# Patient Record
Sex: Female | Born: 1975 | Race: Black or African American | Hispanic: No | Marital: Single | Smoking: Former smoker
Health system: Southern US, Community
[De-identification: ages and names within clinical notes are randomized; demographics above are authoritative.]

## PROBLEM LIST (undated history)

## (undated) DIAGNOSIS — E119 Type 2 diabetes mellitus without complications: Secondary | ICD-10-CM

## (undated) DIAGNOSIS — I10 Essential (primary) hypertension: Secondary | ICD-10-CM

## (undated) DIAGNOSIS — F419 Anxiety disorder, unspecified: Secondary | ICD-10-CM

## (undated) DIAGNOSIS — F329 Major depressive disorder, single episode, unspecified: Secondary | ICD-10-CM

## (undated) DIAGNOSIS — F32A Depression, unspecified: Secondary | ICD-10-CM

## (undated) DIAGNOSIS — E7800 Pure hypercholesterolemia, unspecified: Secondary | ICD-10-CM

## (undated) HISTORY — PX: CHOLECYSTECTOMY: SHX55

## (undated) HISTORY — PX: COLONOSCOPY WITH PROPOFOL: SHX5780

---

## 2007-04-03 ENCOUNTER — Emergency Department: Payer: Self-pay | Admitting: Emergency Medicine

## 2008-11-29 ENCOUNTER — Emergency Department: Payer: Self-pay | Admitting: Emergency Medicine

## 2013-02-05 ENCOUNTER — Emergency Department: Payer: Self-pay | Admitting: Emergency Medicine

## 2013-12-26 LAB — HM HIV SCREENING LAB: HM HIV Screening: NEGATIVE

## 2015-08-15 DIAGNOSIS — E118 Type 2 diabetes mellitus with unspecified complications: Secondary | ICD-10-CM | POA: Insufficient documentation

## 2015-08-15 DIAGNOSIS — E119 Type 2 diabetes mellitus without complications: Secondary | ICD-10-CM | POA: Insufficient documentation

## 2015-08-15 DIAGNOSIS — I10 Essential (primary) hypertension: Secondary | ICD-10-CM | POA: Insufficient documentation

## 2016-03-17 DIAGNOSIS — F419 Anxiety disorder, unspecified: Secondary | ICD-10-CM | POA: Insufficient documentation

## 2017-01-05 DIAGNOSIS — Z716 Tobacco abuse counseling: Secondary | ICD-10-CM | POA: Insufficient documentation

## 2017-01-05 DIAGNOSIS — R634 Abnormal weight loss: Secondary | ICD-10-CM | POA: Insufficient documentation

## 2017-01-05 DIAGNOSIS — Z0000 Encounter for general adult medical examination without abnormal findings: Secondary | ICD-10-CM | POA: Insufficient documentation

## 2017-02-10 ENCOUNTER — Other Ambulatory Visit: Payer: Self-pay

## 2017-02-10 DIAGNOSIS — I10 Essential (primary) hypertension: Secondary | ICD-10-CM | POA: Diagnosis not present

## 2017-02-10 DIAGNOSIS — E119 Type 2 diabetes mellitus without complications: Secondary | ICD-10-CM | POA: Insufficient documentation

## 2017-02-10 DIAGNOSIS — K2980 Duodenitis without bleeding: Secondary | ICD-10-CM | POA: Diagnosis not present

## 2017-02-10 DIAGNOSIS — R1011 Right upper quadrant pain: Secondary | ICD-10-CM | POA: Diagnosis present

## 2017-02-10 LAB — CBC
HCT: 39.4 % (ref 35.0–47.0)
Hemoglobin: 13.2 g/dL (ref 12.0–16.0)
MCH: 30.4 pg (ref 26.0–34.0)
MCHC: 33.5 g/dL (ref 32.0–36.0)
MCV: 90.9 fL (ref 80.0–100.0)
Platelets: 369 K/uL (ref 150–440)
RBC: 4.33 MIL/uL (ref 3.80–5.20)
RDW: 12.4 % (ref 11.5–14.5)
WBC: 12 K/uL — ABNORMAL HIGH (ref 3.6–11.0)

## 2017-02-10 LAB — COMPREHENSIVE METABOLIC PANEL
ALK PHOS: 99 U/L (ref 38–126)
ALT: 21 U/L (ref 14–54)
AST: 22 U/L (ref 15–41)
Albumin: 3.9 g/dL (ref 3.5–5.0)
Anion gap: 11 (ref 5–15)
BILIRUBIN TOTAL: 0.6 mg/dL (ref 0.3–1.2)
BUN: 7 mg/dL (ref 6–20)
CALCIUM: 9.2 mg/dL (ref 8.9–10.3)
CO2: 24 mmol/L (ref 22–32)
CREATININE: 0.93 mg/dL (ref 0.44–1.00)
Chloride: 100 mmol/L — ABNORMAL LOW (ref 101–111)
GFR calc non Af Amer: >60 mL/min (ref >60)
Glucose, Bld: 196 mg/dL — ABNORMAL HIGH (ref 65–99)
Potassium: 3.8 mmol/L (ref 3.5–5.1)
SODIUM: 135 mmol/L (ref 135–145)
TOTAL PROTEIN: 8 g/dL (ref 6.5–8.1)

## 2017-02-10 LAB — URINALYSIS, COMPLETE (UACMP) WITH MICROSCOPIC
BILIRUBIN URINE: NEGATIVE
Glucose, UA: NEGATIVE mg/dL
HGB URINE DIPSTICK: NEGATIVE
Ketones, ur: 20 mg/dL — AB
Leukocytes, UA: NEGATIVE
Nitrite: NEGATIVE
Protein, ur: NEGATIVE mg/dL
SPECIFIC GRAVITY, URINE: 1.015 (ref 1.005–1.030)
pH: 5 (ref 5.0–8.0)

## 2017-02-10 LAB — LIPASE, BLOOD: Lipase: 19 U/L (ref 11–51)

## 2017-02-10 NOTE — ED Triage Notes (Signed)
Pt in with co upper abd pain worse to ruq since yesterday, did vomit earlier today. No diarrhea or hx of the same.

## 2017-02-11 ENCOUNTER — Emergency Department: Payer: BLUE CROSS/BLUE SHIELD

## 2017-02-11 ENCOUNTER — Emergency Department
Admission: EM | Admit: 2017-02-11 | Discharge: 2017-02-11 | Disposition: A | Discharge status: Home / Self Care | Payer: BLUE CROSS/BLUE SHIELD | Attending: Emergency Medicine | Admitting: Emergency Medicine

## 2017-02-11 DIAGNOSIS — K2980 Duodenitis without bleeding: Secondary | ICD-10-CM

## 2017-02-11 DIAGNOSIS — R1011 Right upper quadrant pain: Secondary | ICD-10-CM

## 2017-02-11 HISTORY — DX: Type 2 diabetes mellitus without complications: E11.9

## 2017-02-11 HISTORY — DX: Essential (primary) hypertension: I10

## 2017-02-11 LAB — PREGNANCY, URINE: Preg Test, Ur: NEGATIVE

## 2017-02-11 MED ORDER — MORPHINE SULFATE (PF) 4 MG/ML IV SOLN
4.0000 mg | Freq: Once | INTRAVENOUS | Status: AC
  Administered 2017-02-11: 4 mg via INTRAVENOUS
  Filled 2017-02-11: qty 1

## 2017-02-11 MED ORDER — MORPHINE SULFATE (PF) 4 MG/ML IV SOLN
4.0000 mg | Freq: Once | INTRAVENOUS | Status: AC
  Administered 2017-02-11: 4 mg via INTRAVENOUS

## 2017-02-11 MED ORDER — ONDANSETRON HCL 4 MG/2ML IJ SOLN
4.0000 mg | Freq: Once | INTRAMUSCULAR | Status: AC
  Administered 2017-02-11: 4 mg via INTRAVENOUS

## 2017-02-11 MED ORDER — OXYCODONE-ACETAMINOPHEN 5-325 MG PO TABS
1.0000 | ORAL_TABLET | Freq: Once | ORAL | Status: AC
  Administered 2017-02-11: 1 via ORAL
  Filled 2017-02-11: qty 1

## 2017-02-11 MED ORDER — IOPAMIDOL (ISOVUE-300) INJECTION 61%
125.0000 mL | Freq: Once | INTRAVENOUS | Status: AC | PRN
  Administered 2017-02-11: 125 mL via INTRAVENOUS

## 2017-02-11 MED ORDER — ONDANSETRON HCL 4 MG/2ML IJ SOLN
INTRAMUSCULAR | Status: AC
  Administered 2017-02-11: 4 mg via INTRAVENOUS
  Filled 2017-02-11: qty 2

## 2017-02-11 MED ORDER — OXYCODONE-ACETAMINOPHEN 5-325 MG PO TABS: 1.0000 | ORAL_TABLET | ORAL | 0 refills | Status: DC | PRN

## 2017-02-11 MED ORDER — ONDANSETRON 4 MG PO TBDP: 4.0000 mg | ORAL_TABLET | Freq: Three times a day (TID) | ORAL | 0 refills | Status: DC | PRN

## 2017-02-11 MED ORDER — MORPHINE SULFATE (PF) 4 MG/ML IV SOLN
INTRAVENOUS | Status: AC
  Administered 2017-02-11: 4 mg via INTRAVENOUS
  Filled 2017-02-11: qty 1

## 2017-02-11 NOTE — ED Provider Notes (Signed)
[facility] Emergency Department Provider Note    First MD Initiated Contact with Patient 02/11/17 [phone]     (approximate)  I have reviewed the triage vital signs and the nursing notes.   HISTORY  Chief Complaint Abdominal Pain   HPI Carmen [name] is a 42 y.o. female below list of chronic medical conditions presents to the emergency department upper abdominal discomfort which is currently 10 out of 10 which began yesterday.  Patient admits to one episode of vomiting which happened earlier today night.  Patient denies any diarrhea or constipation.  Patient denies any fever afebrile on presentation temperature 99.5.  Patient denies any personal family history of disease.   Past Medical History:  Diagnosis Date  . Diabetes mellitus without complication ([place])   . Hypertension     There are no active problems to display for this patient.     Prior to Admission medications   Not on File    Allergies No known drug allergies No family history on file.  Social History Social History   Tobacco Use  . Smoking status: Not on file  Substance Use Topics  . Alcohol use: Not on file  . Drug use: Not on file    Review of Systems Constitutional: No fever/chills Eyes: No visual changes. ENT: No sore throat. Cardiovascular: Denies chest pain. Respiratory: Denies shortness of breath. Gastrointestinal: No abdominal pain.  No nausea, no vomiting.  No diarrhea.  No constipation. Genitourinary: Negative for dysuria. Musculoskeletal: Negative for neck pain.  Negative for back pain. Integumentary: Negative for rash. Neurological: Negative for headaches, focal weakness or numbness.  ____________________________________________   PHYSICAL EXAM:  VITAL SIGNS: ED Triage Vitals  Enc Vitals Group     BP 02/10/17 2230 134/89     Pulse Rate 02/10/17 2228 88     Resp 02/10/17 2228 20     Temp 02/10/17 2228 99.5 F (37.5 C)     Temp Source 02/10/17 2228  Oral     SpO2 02/10/17 2228 97 %     Weight 02/10/17 2228 119.7 kg (264 lb)     Height 02/10/17 2228 1.499 m (4\' 11" )     Head Circumference --      Peak Flow --      Pain Score 02/10/17 2227 8     Pain Loc --      Pain Edu? --      Excl. in [place]? --     Constitutional: Alert and oriented.  Apparent discomfort Eyes: Conjunctivae are normal. Head: Atraumatic. Mouth/Throat: Mucous membranes are moist.  Oropharynx non-erythematous. Neck: No stridor.   Cardiovascular: Normal rate, regular rhythm. Good peripheral circulation. Grossly normal heart sounds. Respiratory: Normal respiratory effort.  No retractions. Lungs CTAB. Gastrointestinal: Right upper quadrant tenderness to palpation. No distention.  Musculoskeletal: No lower extremity tenderness nor edema. No gross deformities of extremities. Neurologic:  Normal speech and language. No gross focal neurologic deficits are appreciated.  Skin:  Skin is warm, dry and intact. No rash noted. Psychiatric: Mood and affect are normal. Speech and behavior are normal.  ____________________________________________   LABS (all labs ordered are listed, but only abnormal results are displayed)  Labs Reviewed  CBC - Abnormal; Notable for the following components:      Result Value   WBC 12.0 (*)    All other components within normal limits  COMPREHENSIVE METABOLIC PANEL - Abnormal; Notable for the following components:   Chloride 100 (*)    Glucose, Bld  196 (*)    All other components within normal limits  URINALYSIS, COMPLETE (UACMP) WITH MICROSCOPIC - Abnormal; Notable for the following components:   Color, Urine YELLOW (*)    APPearance HAZY (*)    Ketones, ur 20 (*)    All other components within normal limits  LIPASE, BLOOD  PREGNANCY, URINE   __________________________  RADIOLOGY I, Penrose N BROWN, personally viewed and evaluated these images (plain radiographs) as part of my medical decision making, as well as reviewing the  written report by the radiologist.  Ct Abdomen Pelvis W Contrast  Result Date: 02/11/2017 CLINICAL DATA:  Acute onset of upper abdominal pain and vomiting. EXAM: CT ABDOMEN AND PELVIS WITH CONTRAST TECHNIQUE: Multidetector CT imaging of the abdomen and pelvis was performed using the standard protocol following bolus administration of intravenous contrast. CONTRAST:  163mL ISOVUE-300 IOPAMIDOL (ISOVUE-300) INJECTION 61% COMPARISON:  Right upper quadrant ultrasound performed earlier today at 5:22 a.m. FINDINGS: Lower chest: The visualized lung bases are grossly clear. The visualized portions of the mediastinum are unremarkable. Hepatobiliary: The liver is unremarkable in appearance. The gallbladder is unremarkable in appearance. The common bile duct remains normal in caliber. Pancreas: Minimal soft tissue inflammation is noted about the head of the pancreas; this may reflect mild pancreatitis or possibly an underlying duodenal process. The remainder of the pancreas is unremarkable. There is no evidence of devascularization or pseudocyst formation. Spleen: The spleen is unremarkable in appearance. Adrenals/Urinary Tract: The adrenal glands are unremarkable in appearance. The kidneys are within normal limits. There is no evidence of hydronephrosis. No renal or ureteral stones are identified. No perinephric stranding is seen. Stomach/Bowel: The stomach is unremarkable in appearance. Mild soft tissue inflammation about the second segment of the duodenum could reflect mild duodenitis. Underlying ulceration cannot be excluded. The appendix is normal in caliber, without evidence of appendicitis. The colon is unremarkable in appearance. Vascular/Lymphatic: The abdominal aorta is unremarkable in appearance. The inferior vena cava is grossly unremarkable. No retroperitoneal lymphadenopathy is seen. No pelvic sidewall lymphadenopathy is identified. Reproductive: The bladder is mildly distended and grossly unremarkable. A  small uterine fibroid is noted. The uterus is otherwise unremarkable. A 3.5 cm right adnexal cystic lesion is noted. The left ovary is unremarkable. Other: No additional soft tissue abnormalities are seen. Musculoskeletal: No acute osseous abnormalities are identified. The visualized musculature is unremarkable in appearance. IMPRESSION: 1. Minimal soft tissue inflammation about the head of the pancreas. This may reflect mild pancreatitis or possibly an underlying duodenal process. No evidence of devascularization or pseudocyst formation. 2. Mild soft tissue inflammation about the second segment of the duodenum could reflect mild duodenitis. Underlying ulceration cannot be excluded. 3. 3.5 cm right adnexal cystic lesion. This is likely physiologic, though pelvic ultrasound could be performed for further evaluation, on an elective nonemergent basis. 4. Small uterine fibroid noted. Electronically Signed   By: Garald Balding M.D.   On: 02/11/2017 06:28   US Abdomen Limited Ruq  Result Date: 02/11/2017 CLINICAL DATA:  Acute onset of right upper quadrant abdominal pain. EXAM: ULTRASOUND ABDOMEN LIMITED RIGHT UPPER QUADRANT COMPARISON:  None. FINDINGS: Gallbladder: No gallstones or wall thickening visualized. No sonographic Murphy sign noted by sonographer. Common bile duct: Diameter: 0.3 cm, within normal limits in caliber. Liver: No focal lesion identified. Increased parenchymal echogenicity and coarsened echotexture likely reflects fatty infiltration. Portal vein is patent on color Doppler imaging with normal direction of blood flow towards the liver. IMPRESSION: 1. No acute abnormality seen at the right  upper quadrant. 2. Mild fatty infiltration within the liver. Electronically Signed   By: Garald Balding M.D.   On: 02/11/2017 05:46    Procedures   ____________________________________________   INITIAL IMPRESSION / ASSESSMENT AND PLAN / ED COURSE  As part of my medical decision making, I reviewed the  following data within the electronic MEDICAL RECORD NUMBER4 year old female presented with above-stated history and physical exam secondary to right upper quadrant pain and vomiting.  Consider the possibility of cholelithiasis versus cholecystitis and as such ultrasound was performed which revealed no evidence of cholelithiasis or any other gallbladder disease.  Given patient's ongoing pain despite receiving IV morphine  CT scan of the abdomen performed which revealed mild inflammation pancreatic head and duodenum as per radiology report concern for possible duodenitis versus potential other etiologies.  Spoke with the patient at length regarding the necessity of following up with gastroenterology for further outpatient evaluation.  Patient will be prescribed Percocet and Zofran for home.  Informed patient not to drive or operate machinery or perform any activity that could result in bodily harm while taking Percocet ____________________________________________  FINAL CLINICAL IMPRESSION(S) / ED DIAGNOSES  Final diagnoses:  RUQ pain  Right upper quadrant abdominal pain  Duodenitis     MEDICATIONS GIVEN DURING THIS VISIT:  Medications  morphine 4 MG/ML injection 4 mg (4 mg Intravenous Given 02/11/17 0416)  ondansetron (ZOFRAN) injection 4 mg (4 mg Intravenous Given 02/11/17 0416)  iopamidol (ISOVUE-300) 61 % injection 125 mL (125 mLs Intravenous Contrast Given 02/11/17 0609)  morphine 4 MG/ML injection 4 mg (4 mg Intravenous Given 02/11/17 1657)     ED Discharge Orders    None       Note:  This document was prepared using Dragon voice recognition software and may include unintentional dictation errors.    Gregor Hams, MD 02/11/17 [phone]

## 2017-02-16 ENCOUNTER — Encounter: Payer: Self-pay | Admitting: Gastroenterology

## 2017-02-16 ENCOUNTER — Ambulatory Visit: Payer: BLUE CROSS/BLUE SHIELD | Admitting: Gastroenterology

## 2017-02-16 VITALS — BP 125/85 | HR 75 | Temp 98.2°F | Ht 59.0 in | Wt 253.8 lb

## 2017-02-16 DIAGNOSIS — R1011 Right upper quadrant pain: Secondary | ICD-10-CM | POA: Diagnosis not present

## 2017-02-16 DIAGNOSIS — R109 Unspecified abdominal pain: Secondary | ICD-10-CM | POA: Diagnosis not present

## 2017-02-16 MED ORDER — OMEPRAZOLE 40 MG PO CPDR: 40.0000 mg | DELAYED_RELEASE_CAPSULE | Freq: Every day | ORAL | 3 refills | Status: DC

## 2017-02-16 NOTE — Progress Notes (Signed)
[name] MD, MRCP(U.K) [address]  [place]  [place], [place] [address]  Main: [phone]  Fax: [phone]   Gastroenterology Consultation  Referring Provider:     No ref. provider found Primary Care Physician:  System, Pcp Not In Primary Gastroenterologist:  Dr. [name]  Reason for Consultation:     Abdominal pain         HPI:   Carmen [name] is a 42 y.o. y/o female She has been referred for abdominal pain.  She was seen at the ER on 02/11/2017 when she presented with abdominal pain began the day prior.  It was a associated with one episode of vomiting.  No other lower GI symptoms.  She underwent a CT scan of the abdomen and pelvis which demonstrated minimal soft tissue inflammation around the head of the pancreas.  Mild soft tissue inflammation around the second segment of the duodenum which could reflect mild duodenitis.  Underlying ulceration cannot be excluded.  3.5 cm right adnexal cystic lesion.  She underwent a right upper quadrant ultrasound which demonstrated no 6 acute abnormality mild fatty infiltration of the liver.  Common bile duct is 3 mm.  No gallstones were seen.  Lab results suggested a normal lipase of 19 CMP demonstrated an elevated glucose otherwise her transaminases, alkaline phosphatase, total bilirubin were normal.  Creatinine was 0.93.  Hemoglobin of 13.2 g.  Urine analysis showed no evidence of infection.  And pregnancy test was negative.    Interval history 02/11/2017 to 02/16/2017   Abdominal pain: Onset: A few days prior to the ER visit. Mostly on 02/09/17. Got worse on new years, Since the ER visit it is on and off.  Site :RUQ  Radiation: was travelling to her umbilicus and now mostly in RUQ. She has the pain now  Severity :not severe. Like an ache  Nature of pain:, annoying in nature   Aggravating factors: unsure  Relieving factors :unsure  Weight loss: lost a bit of weight  NSAID use: none  PPI use :none  Gall bladder surgery: no - no  family history of GB issues  Frequency of bowel movements: -sometimes everyday - sometimes every few days. Lately going daily .  Change in bowel movements: no  Relief with bowel movements: yes and no  Gas/Bloating/Abdominal distension: no   Not on any chronic narcotics  Past Medical History:  Diagnosis Date  . Diabetes mellitus without complication ([place])   . Hypertension       Prior to Admission medications   Medication Sig Start Date End Date Taking? Authorizing Provider  lisinopril (PRINIVIL,ZESTRIL) 10 MG tablet Take 10 mg by mouth. 01/05/17  Yes [provider]  metFORMIN (GLUCOPHAGE) 500 MG tablet Take 1,000 mg by mouth. 01/05/17  Yes [provider]  nicotine (NICODERM CQ - DOSED IN MG/24 HR) 7 mg/24hr patch Place onto the skin. 01/05/17  Yes [provider]  sertraline (ZOLOFT) 50 MG tablet Take 50 mg by mouth. 01/05/17  Yes [provider]  ondansetron (ZOFRAN ODT) 4 MG disintegrating tablet Take 1 tablet (4 mg total) by mouth every 8 (eight) hours as needed for nausea or vomiting. 02/11/17   Gregor Hams, MD  oxyCODONE-acetaminophen (ROXICET) 5-325 MG tablet Take 1 tablet by mouth every 4 (four) hours as needed for severe pain. 02/11/17   Gregor Hams, MD    No family history on file.   Social History   Tobacco Use  . Smoking status: Not on file  Substance Use Topics  . Alcohol use: Not on file  . Drug use: Not on file    Allergies as of 02/16/2017  . (No Known Allergies)    Review of Systems:    All systems reviewed and negative except where noted in HPI.   Physical Exam:  There were no vitals taken for this visit. No LMP recorded. Psych:  Alert and cooperative. Normal mood and affect. General:   Alert,  Well-developed, well-nourished, pleasant and cooperative in NAD Head:  Normocephalic and atraumatic. Eyes:  Sclera clear, no icterus.   Conjunctiva pink. Ears:  Normal auditory acuity. Nose:  No deformity,  discharge, or lesions. Mouth:  No deformity or lesions,oropharynx pink & moist. Neck:  Supple; no masses or thyromegaly. Lungs:  Respirations even and unlabored.  Clear throughout to auscultation.   No wheezes, crackles, or rhonchi. No acute distress. Heart:  Regular rate and rhythm; no murmurs, clicks, rubs, or gallops. Abdomen:  Normal bowel sounds.  No bruits.  Soft, non-tender and non-distended without masses, hepatosplenomegaly or hernias noted.  No guarding or rebound tenderness.    Msk:  Symmetrical without gross deformities. Good, equal movement & strength bilaterally. Pulses:  Normal pulses noted. Extremities:  No clubbing or edema.  No cyanosis. Neurologic:  Alert and oriented x3;  grossly normal neurologically. Skin:  Intact without significant lesions or rashes. No jaundice. Lymph Nodes:  No significant cervical adenopathy. Psych:  Alert and cooperative. Normal mood and affect.  Imaging Studies: Ct Abdomen Pelvis W Contrast  Result Date: 02/11/2017 CLINICAL DATA:  Acute onset of upper abdominal pain and vomiting. EXAM: CT ABDOMEN AND PELVIS WITH CONTRAST TECHNIQUE: Multidetector CT imaging of the abdomen and pelvis was performed using the standard protocol following bolus administration of intravenous contrast. CONTRAST:  173mL ISOVUE-300 IOPAMIDOL (ISOVUE-300) INJECTION 61% COMPARISON:  Right upper quadrant ultrasound performed earlier today at 5:22 a.m. FINDINGS: Lower chest: The visualized lung bases are grossly clear. The visualized portions of the mediastinum are unremarkable. Hepatobiliary: The liver is unremarkable in appearance. The gallbladder is unremarkable in appearance. The common bile duct remains normal in caliber. Pancreas: Minimal soft tissue inflammation is noted about the head of the pancreas; this may reflect mild pancreatitis or possibly an underlying duodenal process. The remainder of the pancreas is unremarkable. There is no evidence of devascularization or  pseudocyst formation. Spleen: The spleen is unremarkable in appearance. Adrenals/Urinary Tract: The adrenal glands are unremarkable in appearance. The kidneys are within normal limits. There is no evidence of hydronephrosis. No renal or ureteral stones are identified. No perinephric stranding is seen. Stomach/Bowel: The stomach is unremarkable in appearance. Mild soft tissue inflammation about the second segment of the duodenum could reflect mild duodenitis. Underlying ulceration cannot be excluded. The appendix is normal in caliber, without evidence of appendicitis. The colon is unremarkable in appearance. Vascular/Lymphatic: The abdominal aorta is unremarkable in appearance. The inferior vena cava is grossly unremarkable. No retroperitoneal lymphadenopathy is seen. No pelvic sidewall lymphadenopathy is identified. Reproductive: The bladder is mildly distended and grossly unremarkable. A small uterine fibroid is noted. The uterus is otherwise unremarkable. A 3.5 cm right adnexal cystic lesion is noted. The left ovary is unremarkable. Other: No additional soft tissue abnormalities are seen. Musculoskeletal: No acute osseous abnormalities are identified. The visualized musculature is unremarkable in appearance. IMPRESSION: 1. Minimal soft tissue inflammation about the head of the pancreas. This may reflect mild pancreatitis or possibly an underlying duodenal process. No evidence of devascularization or pseudocyst formation. 2. Mild  soft tissue inflammation about the second segment of the duodenum could reflect mild duodenitis. Underlying ulceration cannot be excluded. 3. 3.5 cm right adnexal cystic lesion. This is likely physiologic, though pelvic ultrasound could be performed for further evaluation, on an elective nonemergent basis. 4. Small uterine fibroid noted. Electronically Signed   By: Garald Balding M.D.   On: 02/11/2017 06:28   US Abdomen Limited Ruq  Result Date: 02/11/2017 CLINICAL DATA:  Acute onset of  right upper quadrant abdominal pain. EXAM: ULTRASOUND ABDOMEN LIMITED RIGHT UPPER QUADRANT COMPARISON:  None. FINDINGS: Gallbladder: No gallstones or wall thickening visualized. No sonographic Murphy sign noted by sonographer. Common bile duct: Diameter: 0.3 cm, within normal limits in caliber. Liver: No focal lesion identified. Increased parenchymal echogenicity and coarsened echotexture likely reflects fatty infiltration. Portal vein is patent on color Doppler imaging with normal direction of blood flow towards the liver. IMPRESSION: 1. No acute abnormality seen at the right upper quadrant. 2. Mild fatty infiltration within the liver. Electronically Signed   By: Garald Balding M.D.   On: 02/11/2017 05:46    Assessment and Plan:   ARIANNI GALLEGO is a 42 y.o. y/o female has been referred for abdominal pain that occurred to the emergency room on 02/10/2017.  A CT scan of the abdomen showed fluid around the pancreas which can sometimes could be suggestive of pancreatitis but her lipase was normal and her abdominal pain was not typical of pancreatitis.  It could also indicate duodenitis which is very likely a possibility.  In addition incidentally an adnexal lesion was seen.  She needs further evaluation.  Plan 1.  Check stool H. pylori antigen 2.  No NSAIDs 3.  EGD to rule out peptic ulcer disease. 4.  She needs further evaluation of her adnexal lesion with her family practice doctor.  The patient has been made aware of this and she has been advised to contact her family practice physician.  Copy of my note will be sent to her PCP.  5. HIDA scan  6. Prilosec 40 mg once daily   I have discussed alternative options, risks & benefits,  which include, but are not limited to, bleeding, infection, perforation,respiratory complication & drug reaction.  The patient agrees with this plan & written consent will be obtained.      Follow up in 6 weeks   Dr [name] MD,MRCP(U.K)

## 2017-02-16 NOTE — Addendum Note (Signed)
Addended by: Peggye Ley on: 02/16/2017 02:49 PM   Modules accepted: Orders, SmartSet

## 2017-02-17 ENCOUNTER — Encounter: Payer: Self-pay | Admitting: *Deleted

## 2017-02-17 ENCOUNTER — Encounter
Admission: RE | Disposition: A | Discharge status: Home / Self Care | Payer: Self-pay | Source: Ambulatory Visit | Attending: Gastroenterology

## 2017-02-17 ENCOUNTER — Ambulatory Visit: Payer: BLUE CROSS/BLUE SHIELD | Admitting: Certified Registered"

## 2017-02-17 ENCOUNTER — Telehealth: Payer: Self-pay | Admitting: Gastroenterology

## 2017-02-17 ENCOUNTER — Ambulatory Visit
Admission: RE | Admit: 2017-02-17 | Discharge: 2017-02-17 | Disposition: A | Discharge status: Home / Self Care | Payer: BLUE CROSS/BLUE SHIELD | Source: Ambulatory Visit | Attending: Gastroenterology | Admitting: Gastroenterology

## 2017-02-17 DIAGNOSIS — E119 Type 2 diabetes mellitus without complications: Secondary | ICD-10-CM | POA: Diagnosis not present

## 2017-02-17 DIAGNOSIS — Z7984 Long term (current) use of oral hypoglycemic drugs: Secondary | ICD-10-CM | POA: Diagnosis not present

## 2017-02-17 DIAGNOSIS — I10 Essential (primary) hypertension: Secondary | ICD-10-CM | POA: Insufficient documentation

## 2017-02-17 DIAGNOSIS — Z79899 Other long term (current) drug therapy: Secondary | ICD-10-CM | POA: Insufficient documentation

## 2017-02-17 DIAGNOSIS — R109 Unspecified abdominal pain: Secondary | ICD-10-CM | POA: Diagnosis present

## 2017-02-17 DIAGNOSIS — R1011 Right upper quadrant pain: Secondary | ICD-10-CM

## 2017-02-17 DIAGNOSIS — F419 Anxiety disorder, unspecified: Secondary | ICD-10-CM | POA: Insufficient documentation

## 2017-02-17 DIAGNOSIS — F17210 Nicotine dependence, cigarettes, uncomplicated: Secondary | ICD-10-CM | POA: Diagnosis not present

## 2017-02-17 HISTORY — PX: ESOPHAGOGASTRODUODENOSCOPY (EGD) WITH PROPOFOL: SHX5813

## 2017-02-17 LAB — GLUCOSE, CAPILLARY: Glucose-Capillary: 188 mg/dL — ABNORMAL HIGH (ref 65–99)

## 2017-02-17 LAB — POCT PREGNANCY, URINE: PREG TEST UR: NEGATIVE

## 2017-02-17 SURGERY — ESOPHAGOGASTRODUODENOSCOPY (EGD) WITH PROPOFOL
Anesthesia: General

## 2017-02-17 MED ORDER — PROPOFOL 500 MG/50ML IV EMUL
INTRAVENOUS | Status: DC | PRN
  Administered 2017-02-17: 120 ug/kg/min via INTRAVENOUS

## 2017-02-17 MED ORDER — MIDAZOLAM HCL 2 MG/2ML IJ SOLN
INTRAMUSCULAR | Status: AC
  Filled 2017-02-17: qty 2

## 2017-02-17 MED ORDER — SODIUM CHLORIDE 0.9 % IV SOLN
INTRAVENOUS | Status: DC
  Administered 2017-02-17: 1000 mL via INTRAVENOUS

## 2017-02-17 MED ORDER — MIDAZOLAM HCL 5 MG/5ML IJ SOLN
INTRAMUSCULAR | Status: DC | PRN
  Administered 2017-02-17: 2 mg via INTRAVENOUS

## 2017-02-17 MED ORDER — GLYCOPYRROLATE 0.2 MG/ML IJ SOLN
INTRAMUSCULAR | Status: DC | PRN
  Administered 2017-02-17: 0.2 mg via INTRAVENOUS

## 2017-02-17 MED ORDER — LIDOCAINE 2% (20 MG/ML) 5 ML SYRINGE
INTRAMUSCULAR | Status: DC | PRN
  Administered 2017-02-17: 25 mg via INTRAVENOUS

## 2017-02-17 MED ORDER — LIDOCAINE HCL (PF) 2 % IJ SOLN
INTRAMUSCULAR | Status: AC
  Filled 2017-02-17: qty 10

## 2017-02-17 MED ORDER — PROPOFOL 500 MG/50ML IV EMUL
INTRAVENOUS | Status: AC
  Filled 2017-02-17: qty 50

## 2017-02-17 MED ORDER — GLYCOPYRROLATE 0.2 MG/ML IJ SOLN
INTRAMUSCULAR | Status: AC
  Filled 2017-02-17: qty 1

## 2017-02-17 MED ORDER — PROPOFOL 10 MG/ML IV BOLUS
INTRAVENOUS | Status: DC | PRN
  Administered 2017-02-17: 30 mg via INTRAVENOUS
  Administered 2017-02-17: 70 mg via INTRAVENOUS

## 2017-02-17 NOTE — Telephone Encounter (Signed)
Patient needs letter for work being out for 02/17/17 and 02/16/17. Please call patient when ready for pick up.

## 2017-02-17 NOTE — Anesthesia Postprocedure Evaluation (Signed)
Anesthesia Post Note  Patient: Carmen [name]  Procedure(s) Performed: ESOPHAGOGASTRODUODENOSCOPY (EGD) WITH PROPOFOL (N/A )  Patient location during evaluation: Endoscopy Anesthesia Type: General Level of consciousness: awake and alert Pain management: pain level controlled Vital Signs Assessment: post-procedure vital signs reviewed and stable Respiratory status: spontaneous breathing and respiratory function stable Cardiovascular status: stable Anesthetic complications: no     Last Vitals:  Vitals:   02/17/17 0921 02/17/17 0931  BP: (!) 87/68 104/83  Pulse: 94 81  Resp: 20 17  Temp:    SpO2: 99% 100%    Last Pain:  Vitals:   02/17/17 0901  TempSrc: Tympanic  PainSc:                  KEPHART,WILLIAM K

## 2017-02-17 NOTE — Transfer of Care (Signed)
Immediate Anesthesia Transfer of Care Note  Patient: Carmen [name]  Procedure(s) Performed: ESOPHAGOGASTRODUODENOSCOPY (EGD) WITH PROPOFOL (N/A )  Patient Location: Endoscopy Unit  Anesthesia Type:General  Level of Consciousness: awake and alert   Airway & Oxygen Therapy: Patient Spontanous Breathing and Patient connected to face mask oxygen  Post-op Assessment: Report given to RN and Post -op Vital signs reviewed and stable  Post vital signs: Reviewed  Last Vitals:  Vitals:   02/17/17 0819 02/17/17 0900  BP: 121/79 101/66  Pulse:  92  Resp:  20  Temp:  (!) 36.4 C  SpO2: 100% 100%    Last Pain:  Vitals:   02/17/17 0900  TempSrc: Tympanic  PainSc:          Complications: No apparent anesthesia complications

## 2017-02-17 NOTE — Anesthesia Post-op Follow-up Note (Signed)
Anesthesia QCDR form completed.        

## 2017-02-17 NOTE — Anesthesia Preprocedure Evaluation (Signed)
Anesthesia Evaluation  Patient identified by MRN, date of birth, ID band Patient awake    Reviewed: Allergy & Precautions, NPO status , Patient's Chart, lab work & pertinent test results  History of Anesthesia Complications Negative for: history of anesthetic complications  Airway Mallampati: II       Dental   Pulmonary neg COPD, Current Smoker,           Cardiovascular hypertension, Pt. on medications (-) Past MI and (-) CHF (-) dysrhythmias (-) Valvular Problems/Murmurs     Neuro/Psych neg Seizures Anxiety    GI/Hepatic Neg liver ROS, neg GERD  ,  Endo/Other  diabetes, Type 2, Oral Hypoglycemic Agents  Renal/GU negative Renal ROS     Musculoskeletal   Abdominal   Peds  Hematology   Anesthesia Other Findings   Reproductive/Obstetrics                             Anesthesia Physical Anesthesia Plan  ASA: III  Anesthesia Plan: General   Post-op Pain Management:    Induction:   PONV Risk Score and Plan: 3 and TIVA and Propofol infusion  Airway Management Planned: Nasal Cannula  Additional Equipment:   Intra-op Plan:   Post-operative Plan:   Informed Consent: I have reviewed the patients History and Physical, chart, labs and discussed the procedure including the risks, benefits and alternatives for the proposed anesthesia with the patient or authorized representative who has indicated his/her understanding and acceptance.     Plan Discussed with:   Anesthesia Plan Comments:         Anesthesia Quick Evaluation

## 2017-02-17 NOTE — H&P (Signed)
Carmen Bellows, MD [address] [place], [place], [place], [address] [address], [place], [place], [place], [address] Phone: [phone]  Fax: [phone]  Primary Care Physician:  Inge Rise, NP   Pre-Procedure History & Physical: HPI:  Carmen [name] is a 42 y.o. female is here for an endoscopy    Past Medical History:  Diagnosis Date  . Diabetes mellitus without complication ([place])   . Hypertension     Past Surgical History:  Procedure Laterality Date  . COLONOSCOPY WITH PROPOFOL      Prior to Admission medications   Medication Sig Start Date End Date Taking? Authorizing Provider  lisinopril (PRINIVIL,ZESTRIL) 10 MG tablet Take 10 mg by mouth. 01/05/17   [provider]  metFORMIN (GLUCOPHAGE) 500 MG tablet Take 1,000 mg by mouth. 01/05/17   [provider]  omeprazole (PRILOSEC) 40 MG capsule Take 1 capsule (40 mg total) by mouth daily. 02/16/17   Carmen Bellows, MD  ondansetron (ZOFRAN ODT) 4 MG disintegrating tablet Take 1 tablet (4 mg total) by mouth every 8 (eight) hours as needed for nausea or vomiting. Patient not taking: Reported on 02/16/2017 02/11/17   Gregor Hams, MD  oxyCODONE-acetaminophen (ROXICET) 5-325 MG tablet Take 1 tablet by mouth every 4 (four) hours as needed for severe pain. Patient not taking: Reported on 02/16/2017 02/11/17   Gregor Hams, MD  sertraline (ZOLOFT) 50 MG tablet Take 50 mg by mouth. 01/05/17   [provider]    Allergies as of 02/16/2017  . (No Known Allergies)    History reviewed. No pertinent family history.  Social History   Socioeconomic History  . Marital status: Single    Spouse name: Not on file  . Number of children: Not on file  . Years of education: Not on file  . Highest education level: Not on file  Social Needs  . Financial resource strain: Not on file  . Food insecurity - worry: Not on file  . Food insecurity - inability: Not on file  . Transportation needs -  medical: Not on file  . Transportation needs - non-medical: Not on file  Occupational History  . Not on file  Tobacco Use  . Smoking status: Current Some Day Smoker    Packs/day: 1.00    Types: Cigarettes  . Smokeless tobacco: Never Used  Substance and Sexual Activity  . Alcohol use: No    Frequency: Never  . Drug use: No  . Sexual activity: Yes    Birth control/protection: None  Other Topics Concern  . Not on file  Social History Narrative  . Not on file    Review of Systems: See HPI, otherwise negative ROS  Physical Exam: BP 121/79   Pulse 60   Temp (!) 97.2 F (36.2 C) (Tympanic)   Resp 20   Ht 4\' 11"  (1.499 m)   Wt 253 lb (114.8 kg)   SpO2 100%   BMI 51.10 kg/m  General:   Alert,  pleasant and cooperative in NAD Head:  Normocephalic and atraumatic. Neck:  Supple; no masses or thyromegaly. Lungs:  Clear throughout to auscultation, normal respiratory effort.    Heart:  +S1, +S2, Regular rate and rhythm, No edema. Abdomen:  Soft, nontender and nondistended. Normal bowel sounds, without guarding, and without rebound.   Neurologic:  Alert and  oriented x4;  grossly normal neurologically.  Impression/Plan: Carmen [name] is here for an endoscopy  to be performed for  evaluation  of abdominal pain     Risks, benefits, limitations, and alternatives regarding endoscopy have been reviewed with the patient.  Questions have been answered.  All parties agreeable.   Carmen Bellows, MD  02/17/2017, 8:22 AM

## 2017-02-17 NOTE — Op Note (Signed)
[facility] Patient Name: Carmen [name] Procedure Date: 02/17/2017 8:43 AM MRN: [id number] Account #: [id number] Date of Birth: 11/09/1975 Admit Type: Outpatient Age: 42 Room: [facility] ENDO ROOM 1 Gender: Female Note Status: Finalized Procedure:            Upper GI endoscopy Indications:          Abdominal pain Providers:            [name] MD, MD Referring MD:         No Local Md, MD (Referring MD) Medicines:            Monitored Anesthesia Care Complications:        No immediate complications. Procedure:            Pre-Anesthesia Assessment:                       - Prior to the procedure, a History and Physical was                        performed, and patient medications, allergies and                        sensitivities were reviewed. The patient's tolerance of                        previous anesthesia was reviewed.                       - The risks and benefits of the procedure and the                        sedation options and risks were discussed with the                        patient. All questions were answered and informed                        consent was obtained.                       - ASA Grade Assessment: III - A patient with severe                        systemic disease.                       After obtaining informed consent, the endoscope was                        passed under direct vision. Throughout the procedure,                        the patient's blood pressure, pulse, and oxygen                        saturations were monitored continuously. The Endoscope                        was introduced through the mouth, and advanced to the  third part of duodenum. The upper GI endoscopy was                        accomplished with ease. The patient tolerated the                        procedure well. Findings:      The examined duodenum was normal.      The esophagus was normal.      The entire  examined stomach was normal. Biopsies were taken with a cold       forceps for histology. Impression:           - Normal examined duodenum.                       - Normal esophagus.                       - Normal stomach. Biopsied. Recommendation:       - Discharge patient to home (with escort).                       - Resume previous diet.                       - Continue present medications.                       - Await pathology results.                       - Return to my office in 6 weeks. Procedure Code(s):    --- Professional ---                       [phone], Esophagogastroduodenoscopy, flexible, transoral;                        with biopsy, single or multiple Diagnosis Code(s):    --- Professional ---                       R10.9, Unspecified abdominal pain CPT copyright 2016 American Medical Association. All rights reserved. The codes documented in this report are preliminary and upon coder review may  be revised to meet current compliance requirements. [name], MD [name] MD, MD 02/17/2017 8:54:30 AM This report has been signed electronically. Number of Addenda: 0 Note Initiated On: 02/17/2017 8:43 AM      [facility]

## 2017-02-18 ENCOUNTER — Encounter: Payer: Self-pay | Admitting: Gastroenterology

## 2017-02-18 LAB — SURGICAL PATHOLOGY

## 2017-02-19 ENCOUNTER — Encounter: Payer: Self-pay | Admitting: Gastroenterology

## 2017-02-19 ENCOUNTER — Telehealth: Payer: Self-pay

## 2017-02-19 LAB — H. PYLORI ANTIGEN, STOOL: H pylori Ag, Stl: NEGATIVE

## 2017-02-19 NOTE — Telephone Encounter (Signed)
Completed patient's work Museum/gallery conservator.   LVM for patient to come and pick-up.

## 2017-02-23 ENCOUNTER — Encounter: Payer: Self-pay | Admitting: Gastroenterology

## 2017-02-24 ENCOUNTER — Ambulatory Visit: Payer: BLUE CROSS/BLUE SHIELD | Admitting: Gastroenterology

## 2017-02-25 ENCOUNTER — Telehealth: Payer: Self-pay | Admitting: Gastroenterology

## 2017-02-25 NOTE — Telephone Encounter (Signed)
Patient called and wants to know when you scheduled her colonoscopy and she needs instuctions and an rx for prep. Please call

## 2017-02-26 NOTE — Telephone Encounter (Signed)
LVM for patient callback.   Per notes patient was only seen for EGD not colonoscopy.

## 2017-02-27 ENCOUNTER — Telehealth: Payer: Self-pay | Admitting: Gastroenterology

## 2017-02-27 NOTE — Telephone Encounter (Signed)
Patient returned your call.

## 2017-02-27 NOTE — Telephone Encounter (Signed)
LVM responding to patient's question left on voicemail.   No colonoscopy has been scheduled.  Colonoscopies for patient's under the age of 79 are due to a family history of colon cancer or polyps. Most insurances do not cover colonoscopies without said diagnoses.

## 2017-03-04 ENCOUNTER — Ambulatory Visit
Admission: RE | Admit: 2017-03-04 | Discharge: 2017-03-04 | Disposition: A | Discharge status: Home / Self Care | Payer: BLUE CROSS/BLUE SHIELD | Source: Ambulatory Visit | Attending: Gastroenterology | Admitting: Gastroenterology

## 2017-03-04 DIAGNOSIS — R1011 Right upper quadrant pain: Secondary | ICD-10-CM | POA: Diagnosis not present

## 2017-03-04 MED ORDER — TECHNETIUM TC 99M MEBROFENIN IV KIT
5.0910 | PACK | Freq: Once | INTRAVENOUS | Status: AC | PRN
  Administered 2017-03-04: 5.091 via INTRAVENOUS

## 2017-03-05 ENCOUNTER — Telehealth: Payer: Self-pay

## 2017-03-05 ENCOUNTER — Telehealth: Payer: Self-pay | Admitting: Gastroenterology

## 2017-03-05 DIAGNOSIS — R1011 Right upper quadrant pain: Secondary | ICD-10-CM

## 2017-03-05 NOTE — Telephone Encounter (Signed)
Results

## 2017-03-05 NOTE — Telephone Encounter (Signed)
LVM for patient callback for results per Dr. Vicente Males.  - inform GB ejection fraction is low- can explain pain in RUQ . Please refer to Dr Dahlia Byes to discuss if she will benefit from cholecystectomy .

## 2017-03-06 ENCOUNTER — Other Ambulatory Visit: Payer: Self-pay

## 2017-03-30 ENCOUNTER — Ambulatory Visit: Payer: BLUE CROSS/BLUE SHIELD | Admitting: Surgery

## 2017-03-30 ENCOUNTER — Ambulatory Visit: Payer: Self-pay | Admitting: Surgery

## 2017-03-30 ENCOUNTER — Encounter: Payer: Self-pay | Admitting: Surgery

## 2017-03-30 VITALS — BP 166/88 | HR 102 | Temp 97.7°F | Ht 59.0 in | Wt 255.0 lb

## 2017-03-30 DIAGNOSIS — K828 Other specified diseases of gallbladder: Secondary | ICD-10-CM

## 2017-03-30 NOTE — Patient Instructions (Signed)
Please give [place] a call in case you have any questions or concerns.  Please think about what you would like to do and then we could discuss when to schedule your surgery.

## 2017-03-31 ENCOUNTER — Telehealth: Payer: Self-pay | Admitting: Surgery

## 2017-03-31 ENCOUNTER — Ambulatory Visit: Payer: BLUE CROSS/BLUE SHIELD | Admitting: Gastroenterology

## 2017-03-31 ENCOUNTER — Encounter: Payer: Self-pay | Admitting: Gastroenterology

## 2017-03-31 VITALS — BP 118/81 | HR 80 | Temp 97.5°F | Ht 59.0 in | Wt 256.8 lb

## 2017-03-31 DIAGNOSIS — R1011 Right upper quadrant pain: Secondary | ICD-10-CM | POA: Diagnosis not present

## 2017-03-31 NOTE — Telephone Encounter (Signed)
Would like to proceed with scheduling surgery - please call her to discuss

## 2017-03-31 NOTE — Progress Notes (Signed)
[name] MD, MRCP(U.K) [address]  [place]  [place], [place] [address]  Main: [phone]  Fax: [phone]   Primary Care Physician: Inge Rise, NP  Primary Gastroenterologist:  Dr. [name]   Chief Complaint  Patient presents with  . Follow-up    HPI: Carmen [name] is a 42 y.o. female   Summary of history : Carmen [name] is a 42 y.o. y/o female here to follow up abdominal pain.  She was seen at the ER on 02/11/2017 when she presented with abdominal pain began the day prior.  It was a associated with one episode of vomiting.  No other lower GI symptoms.  She underwent a CT scan of the abdomen and pelvis which demonstrated minimal soft tissue inflammation around the head of the pancreas.  Mild soft tissue inflammation around the second segment of the duodenum which could reflect mild duodenitis.  Underlying ulceration cannot be excluded.  3.5 cm right adnexal cystic lesion.  She underwent a right upper quadrant ultrasound which demonstrated no 6 acute abnormality mild fatty infiltration of the liver.  Common bile duct is 3 mm.  No gallstones were seen.  Lab results suggested a normal lipase of 19 CMP demonstrated an elevated glucose otherwise her transaminases, alkaline phosphatase, total bilirubin were normal.  Creatinine was 0.93.  Hemoglobin of 13.2 g.  Urine analysis showed no evidence of infection.  And pregnancy test was negative.    Interval history 02/16/2017-03/31/17  Normal HIDA scan , low EF of gall bladder.  H pylori stool antigen negative.   EGD 02/2017 : gastric bx-normal , EGD no abnormal findings.   She saw Dr Dahlia Byes yesterday for possible cholecystectomy . She says that she was offered surgery . She informed him today to schedule her for surgery . Still has RUQ pain. sometimes with food and sometimes without. She recollects no pain during the HIDA scan .     Current Outpatient Medications  Medication Sig Dispense Refill  . aspirin EC 81 MG  tablet Take 81 mg by mouth 1 day or 1 dose.    Marland Kitchen atorvastatin (LIPITOR) 20 MG tablet Take 20 mg by mouth 1 day or 1 dose.    Marland Kitchen lisinopril (PRINIVIL,ZESTRIL) 10 MG tablet Take 10 mg by mouth.    . metFORMIN (GLUCOPHAGE) 500 MG tablet Take 1,000 mg by mouth.     No current facility-administered medications for this visit.     Allergies as of 03/31/2017  . (No Known Allergies)    ROS:  General: Negative for anorexia, weight loss, fever, chills, fatigue, weakness. ENT: Negative for hoarseness, difficulty swallowing , nasal congestion. CV: Negative for chest pain, angina, palpitations, dyspnea on exertion, peripheral edema.  Respiratory: Negative for dyspnea at rest, dyspnea on exertion, cough, sputum, wheezing.  GI: See history of present illness. GU:  Negative for dysuria, hematuria, urinary incontinence, urinary frequency, nocturnal urination.  Endo: Negative for unusual weight change.    Physical Examination:   BP 118/81 (BP Location: Left Arm, Patient Position: Sitting, Cuff Size: Large)   Pulse 80   Temp (!) 97.5 F (36.4 C) (Oral)   Ht 4\' 11"  (1.499 m)   Wt 256 lb 12.8 oz (116.5 kg)   BMI 51.87 kg/m   General: Well-nourished, well-developed in no acute distress.  Eyes: No icterus. Conjunctivae pink. Mouth: Oropharyngeal mucosa moist and pink , no lesions erythema or exudate. Lungs: Clear to auscultation bilaterally. Non-labored. Heart: Regular rate and rhythm, no murmurs rubs  or gallops.  Abdomen: Bowel sounds are normal, nontender, nondistended, no hepatosplenomegaly or masses, no abdominal bruits or hernia , no rebound or guarding.   Extremities: No lower extremity edema. No clubbing or deformities. Neuro: Alert and oriented x 3.  Grossly intact. Skin: Warm and dry, no jaundice.   Psych: Alert and cooperative, normal mood and affect.   Imaging Studies: Nm Hepato W/eject Fract  Result Date: 03/04/2017 CLINICAL DATA:  Right upper quadrant pain for several days.   Nausea. EXAM: NUCLEAR MEDICINE HEPATOBILIARY IMAGING WITH GALLBLADDER EF TECHNIQUE: Sequential images of the abdomen were obtained out to 60 minutes following intravenous administration of radiopharmaceutical. After oral ingestion of Ensure, gallbladder ejection fraction was determined. At 60 min, normal ejection fraction is greater than 33%. RADIOPHARMACEUTICALS:  5.1 mCi Tc-69m  Choletec IV COMPARISON:  None. FINDINGS: Prompt uptake and biliary excretion of activity by the liver is seen. Gallbladder activity is visualized, consistent with patency of cystic duct. Biliary activity passes into small bowel, consistent with patent common bile duct. Calculated gallbladder ejection fraction is 36%. (Normal gallbladder ejection fraction with Ensure is greater than 33%.) IMPRESSION: Normal imaging study demonstrating patency of both cystic and common bile ducts. Low normal gallbladder ejection fraction of 36%. Electronically Signed   By: Earle Gell M.D.   On: 03/04/2017 13:11    Assessment and Plan:   Carmen [name] is a 42 y.o. y/o female here to follow up for abdominal pain that occurred to the emergency room on 02/10/2017.  A CT scan of the abdomen showed fluid around the pancreas which can sometimes could be suggestive of pancreatitis but her lipase was normal and her abdominal pain was not typical of pancreatitis.  Plan:  1. CT abdomen in 08/2017 to evaluate the pancreas which appeared abnormal in 02/2017   Dr [name]  MD,MRCP [facility]) Follow up in 6 months .

## 2017-04-01 ENCOUNTER — Encounter: Payer: Self-pay | Admitting: Surgery

## 2017-04-01 MED ORDER — INDOCYANINE GREEN 25 MG IV SOLR: 2.5000 mg | INTRAVENOUS | Status: AC

## 2017-04-01 NOTE — Progress Notes (Signed)
Patient ID: Carmen [name], female   DOB: 1975-02-19, 42 y.o.   MRN: [id number]  HPI Carmen [name] is a 42 y.o. female referred by Dr. Vicente Males. She has a history of intermittent right upper quadrant abdominal pain, has been present for several weeks. She reports that the pain is in the right upper quadrant intermittent and sharp in nature. Sometimes the pain worsening with meals but not all the time. She did have some nausea and some vomiting at some point in time. She underwent a right upper quadrant ultrasound which I have personally reviewed , demonstrating mild fatty infiltration of the liver.  Common bile duct is 3 mm.  No gallstones were seen.  Lab results suggested a normal lipase of 19 CMP demonstrated an elevated glucose otherwise her transaminases, alkaline phosphatase, total bilirubin were normal.  Creatinine was 0.93.  Hemoglobin of 13.2 g.  She also had a somewhat equivoval HIDA w decrease EF ( ensure) but she reports some reproductions of symptoms. No evidence of biliary obstruction, fevers, cholangitis.  Ct scan personally reviewed mild fluid around pancreas, no other acute abnormalities. EGD by Dr. Vicente Males was nml. She is able to perform more than 4 METS w/o SOB or C/p.   HPI  Past Medical History:  Diagnosis Date  . Diabetes mellitus without complication ([place])   . Hypertension     Past Surgical History:  Procedure Laterality Date  . COLONOSCOPY WITH PROPOFOL    . ESOPHAGOGASTRODUODENOSCOPY (EGD) WITH PROPOFOL N/A 02/17/2017   Procedure: ESOPHAGOGASTRODUODENOSCOPY (EGD) WITH PROPOFOL;  Surgeon: [name], MD;  [facility] ENDOSCOPY;  Service: Gastroenterology;  Laterality: N/A;    No pertinent fam hx  Social History Social History   Tobacco Use  . Smoking status: Current Some Day Smoker    Packs/day: 1.00    Types: Cigarettes  . Smokeless tobacco: Never Used  Substance Use Topics  . Alcohol use: No    Frequency: Never  . Drug use: No    No Known  Allergies  Current Outpatient Medications  Medication Sig Dispense Refill  . aspirin EC 81 MG tablet Take 81 mg by mouth 1 day or 1 dose.    Marland Kitchen atorvastatin (LIPITOR) 20 MG tablet Take 20 mg by mouth 1 day or 1 dose.    Marland Kitchen lisinopril (PRINIVIL,ZESTRIL) 10 MG tablet Take 10 mg by mouth.    . metFORMIN (GLUCOPHAGE) 500 MG tablet Take 1,000 mg by mouth.     No current facility-administered medications for this visit.      Review of Systems Full ROS  was asked and was negative except for the information on the HPI  Physical Exam Blood pressure (!) 166/88, pulse (!) 102, temperature 97.7 F (36.5 C), temperature source Oral, height 4\' 11"  (1.499 m), weight 115.7 kg (255 lb). CONSTITUTIONAL: Obese in NAD EYES: Pupils are equal, round, and reactive to light, Sclera are non-icteric. EARS, NOSE, MOUTH AND THROAT: The oropharynx is clear. The oral mucosa is pink and moist. Hearing is intact to voice. LYMPH NODES:  Lymph nodes in the neck are normal. RESPIRATORY:  Lungs are clear. There is normal respiratory effort, with equal breath sounds bilaterally, and without pathologic use of accessory muscles. CARDIOVASCULAR: Heart is regular without murmurs, gallops, or rubs. GI: The abdomen is soft, nontender, and nondistended. There are no palpable masses. There is no hepatosplenomegaly. There are normal bowel sounds in all quadrants. GU: Rectal deferred.   MUSCULOSKELETAL: Normal muscle strength and tone. No cyanosis or edema.  SKIN: Turgor is good and there are no pathologic skin lesions or ulcers. NEUROLOGIC: Motor and sensation is grossly normal. Cranial nerves are grossly intact. PSYCH:  Oriented to person, place and time. Affect is normal.  Data Reviewed  I have personally reviewed the patient's imaging, laboratory findings and medical records.    Assessment/Plan Abdominal pain the liquid consistent with biliary dyskinesia. She had an equivocal HIDA scan but with process of reproduction of  symptoms. At a lengthy discussion with the patient about the options of proceeding with  Cholecystectomy. So far her very comprehensive GI workup has only yield gallbladder disease. There is no other explanation of her symptoms. After lengthy discussion with her and she has also discussed with Dr. Vicente Males and she has agreed to proceed with cholecystectomy. And she is obese and I do actually think that she is a very good candidate for robotic-assisted laparoscopic cholecystectomy given her body habitus. We'll also be able to perform florescent cholangiography to make the procedure safer. The risks, benefits, complications, treatment options, and expected outcomes were discussed with the patient. The possibilities of bleeding, recurrent infection, finding a normal gallbladder, perforation of viscus organs, damage to surrounding structures, bile leak, abscess formation, needing a drain placed, the need for additional procedures, reaction to medication, pulmonary aspiration,  failure to diagnose a condition, the possible need to convert to an open procedure, and creating a complication requiring transfusion or operation were discussed with the patient. The patient and/or family concurred with the proposed plan, giving informed consent.   Caroleen Hamman, MD FACS General Surgeon 04/01/2017, 4:08 PM

## 2017-04-01 NOTE — Addendum Note (Signed)
Addended by: Caroleen Hamman F on: 04/01/2017 05:43 PM   Modules accepted: Orders

## 2017-04-01 NOTE — H&P (View-Only) (Signed)
Patient ID: Carmen [name], female   DOB: Aug 12, 1975, 42 y.o.   MRN: [id number]  HPI Carmen [name] is a 42 y.o. female referred by Dr. Vicente Males. She has a history of intermittent right upper quadrant abdominal pain, has been present for several weeks. She reports that the pain is in the right upper quadrant intermittent and sharp in nature. Sometimes the pain worsening with meals but not all the time. She did have some nausea and some vomiting at some point in time. She underwent a right upper quadrant ultrasound which I have personally reviewed , demonstrating mild fatty infiltration of the liver.  Common bile duct is 3 mm.  No gallstones were seen.  Lab results suggested a normal lipase of 19 CMP demonstrated an elevated glucose otherwise her transaminases, alkaline phosphatase, total bilirubin were normal.  Creatinine was 0.93.  Hemoglobin of 13.2 g.  She also had a somewhat equivoval HIDA w decrease EF ( ensure) but she reports some reproductions of symptoms. No evidence of biliary obstruction, fevers, cholangitis.  Ct scan personally reviewed mild fluid around pancreas, no other acute abnormalities. EGD by Dr. Vicente Males was nml. She is able to perform more than 4 METS w/o SOB or C/p.   HPI  Past Medical History:  Diagnosis Date  . Diabetes mellitus without complication ([place])   . Hypertension     Past Surgical History:  Procedure Laterality Date  . COLONOSCOPY WITH PROPOFOL    . ESOPHAGOGASTRODUODENOSCOPY (EGD) WITH PROPOFOL N/A 02/17/2017   Procedure: ESOPHAGOGASTRODUODENOSCOPY (EGD) WITH PROPOFOL;  Surgeon: [name], MD;  Location: [facility] ENDOSCOPY;  Service: Gastroenterology;  Laterality: N/A;    No pertinent fam hx  Social History Social History   Tobacco Use  . Smoking status: Current Some Day Smoker    Packs/day: 1.00    Types: Cigarettes  . Smokeless tobacco: Never Used  Substance Use Topics  . Alcohol use: No    Frequency: Never  . Drug use: No    No Known  Allergies  Current Outpatient Medications  Medication Sig Dispense Refill  . aspirin EC 81 MG tablet Take 81 mg by mouth 1 day or 1 dose.    Marland Kitchen atorvastatin (LIPITOR) 20 MG tablet Take 20 mg by mouth 1 day or 1 dose.    Marland Kitchen lisinopril (PRINIVIL,ZESTRIL) 10 MG tablet Take 10 mg by mouth.    . metFORMIN (GLUCOPHAGE) 500 MG tablet Take 1,000 mg by mouth.     No current facility-administered medications for this visit.      Review of Systems Full ROS  was asked and was negative except for the information on the HPI  Physical Exam Blood pressure (!) 166/88, pulse (!) 102, temperature 97.7 F (36.5 C), temperature source Oral, height 4\' 11"  (1.499 m), weight 115.7 kg (255 lb). CONSTITUTIONAL: Obese in NAD EYES: Pupils are equal, round, and reactive to light, Sclera are non-icteric. EARS, NOSE, MOUTH AND THROAT: The oropharynx is clear. The oral mucosa is pink and moist. Hearing is intact to voice. LYMPH NODES:  Lymph nodes in the neck are normal. RESPIRATORY:  Lungs are clear. There is normal respiratory effort, with equal breath sounds bilaterally, and without pathologic use of accessory muscles. CARDIOVASCULAR: Heart is regular without murmurs, gallops, or rubs. GI: The abdomen is soft, nontender, and nondistended. There are no palpable masses. There is no hepatosplenomegaly. There are normal bowel sounds in all quadrants. GU: Rectal deferred.   MUSCULOSKELETAL: Normal muscle strength and tone. No cyanosis or edema.  SKIN: Turgor is good and there are no pathologic skin lesions or ulcers. NEUROLOGIC: Motor and sensation is grossly normal. Cranial nerves are grossly intact. PSYCH:  Oriented to person, place and time. Affect is normal.  Data Reviewed  I have personally reviewed the patient's imaging, laboratory findings and medical records.    Assessment/Plan Abdominal pain the liquid consistent with biliary dyskinesia. She had an equivocal HIDA scan but with process of reproduction of  symptoms. At a lengthy discussion with the patient about the options of proceeding with  Cholecystectomy. So far her very comprehensive GI workup has only yield gallbladder disease. There is no other explanation of her symptoms. After lengthy discussion with her and she has also discussed with Dr. Vicente Males and she has agreed to proceed with cholecystectomy. And she is obese and I do actually think that she is a very good candidate for robotic-assisted laparoscopic cholecystectomy given her body habitus. We'll also be able to perform florescent cholangiography to make the procedure safer. The risks, benefits, complications, treatment options, and expected outcomes were discussed with the patient. The possibilities of bleeding, recurrent infection, finding a normal gallbladder, perforation of viscus organs, damage to surrounding structures, bile leak, abscess formation, needing a drain placed, the need for additional procedures, reaction to medication, pulmonary aspiration,  failure to diagnose a condition, the possible need to convert to an open procedure, and creating a complication requiring transfusion or operation were discussed with the patient. The patient and/or family concurred with the proposed plan, giving informed consent.   Caroleen Hamman, MD FACS General Surgeon 04/01/2017, 4:08 PM

## 2017-04-03 NOTE — Telephone Encounter (Signed)
Pt advised of pre op date/time and sx date. Sx: 04/23/17 with Dr Kris Mouton assisted cholecystectomy.  Pre op: 04/16/17 between 9-1:00pm--phone interview.   Patient made aware to call [phone], between 1-3:00pm the day before surgery, to find out what time to arrive.

## 2017-04-16 ENCOUNTER — Other Ambulatory Visit: Payer: Self-pay

## 2017-04-16 ENCOUNTER — Encounter
Admission: RE | Admit: 2017-04-16 | Discharge: 2017-04-16 | Disposition: A | Discharge status: Home / Self Care | Payer: BLUE CROSS/BLUE SHIELD | Source: Ambulatory Visit | Attending: Surgery | Admitting: Surgery

## 2017-04-16 HISTORY — DX: Major depressive disorder, single episode, unspecified: F32.9

## 2017-04-16 HISTORY — DX: Depression, unspecified: F32.A

## 2017-04-16 HISTORY — DX: Anxiety disorder, unspecified: F41.9

## 2017-04-16 HISTORY — DX: Morbid (severe) obesity due to excess calories: E66.01

## 2017-04-16 HISTORY — DX: Pure hypercholesterolemia, unspecified: E78.00

## 2017-04-16 NOTE — Patient Instructions (Signed)
Your procedure is scheduled on: 04-23-17 THURSDAY Report to Same Day Surgery 2nd floor medical mall [facility] Entrance-take elevator on left to 2nd floor.  Check in with surgery information desk.) To find out your arrival time please call [phone] between 1PM - 3PM on 04-22-17 [facility]  Remember: Instructions that are not followed completely may result in serious medical risk, up to and including death, or upon the discretion of your surgeon and anesthesiologist your surgery may need to be rescheduled.    _x___ 1. Do not eat food after midnight the night before your procedure. NO GUM OR CANDY AFTER MIDNIGHT.  You may drink WATER up to 2 hours before you are scheduled to arrive at the hospital for your procedure.  Do not drink WATER within 2 hours of your scheduled arrival to the hospital.  Type 1 and type 2 diabetics should only drink water.     __x__ 2. No Alcohol for 24 hours before or after surgery.   __x__3. No Smoking or e-cigarettes for 24 prior to surgery.  Do not use any chewable tobacco products for at least 6 hour prior to surgery   ____  4. Bring all medications with you on the day of surgery if instructed.    __x__ 5. Notify your doctor if there is any change in your medical condition     (cold, fever, infections).    x___6. On the morning of surgery brush your teeth with toothpaste and water.  You may rinse your mouth with mouth wash if you wish.  Do not swallow any toothpaste or mouthwash.   Do not wear jewelry, make-up, hairpins, clips or nail polish.  Do not wear lotions, powders, or perfumes. You may wear deodorant.  Do not shave 48 hours prior to surgery. Men may shave face and neck.  Do not bring valuables to the hospital.    [facility] is not responsible for any belongings or valuables.               Contacts, dentures or bridgework may not be worn into surgery.  Leave your suitcase in the car. After surgery it may be brought to your room.  For patients  admitted to the hospital, discharge time is determined by your treatment team.  _  Patients discharged the day of surgery will not be allowed to drive home.  You will need someone to drive you home and stay with you the night of your procedure.    Please read over the following fact sheets that you were given:   [facility] Preparing for Surgery and or MRSA Information   ____ TAKE THE FOLLOWING MEDICATION THE MORNING OF SURGERY WITH A SMALL SIP OF WATER. These include:  1. NONE  2.  3.  4.  5.  6.  ____Fleets enema or Magnesium Citrate as directed.   _x___ Use CHG Soap or sage wipes as directed on instruction sheet   ____ Use inhalers on the day of surgery and bring to hospital day of surgery  _X___ Stop Metformin 2 days prior to surgery-LAST DOSE ON Monday, MARCH 11TH   ____ Take 1/2 of usual insulin dose the night before surgery and none on the morning surgery.   ____ Follow recommendations from Cardiologist, Pulmonologist or PCP regarding stopping Aspirin, Coumadin, Plavix ,Eliquis, Effient, or Pradaxa, and Pletal.  X____Stop Anti-inflammatories such as Advil, Aleve, Ibuprofen, Motrin, Naproxen, Naprosyn, Goodies powders or aspirin products NOW-OK to take Tylenol    ____ Stop supplements until  after surgery.     ____ Bring C-Pap to the hospital.

## 2017-04-20 ENCOUNTER — Encounter
Admission: RE | Admit: 2017-04-20 | Discharge: 2017-04-20 | Disposition: A | Discharge status: Home / Self Care | Payer: BLUE CROSS/BLUE SHIELD | Source: Ambulatory Visit | Attending: Surgery | Admitting: Surgery

## 2017-04-20 DIAGNOSIS — I10 Essential (primary) hypertension: Secondary | ICD-10-CM | POA: Diagnosis not present

## 2017-04-20 DIAGNOSIS — Z7984 Long term (current) use of oral hypoglycemic drugs: Secondary | ICD-10-CM | POA: Diagnosis not present

## 2017-04-20 DIAGNOSIS — Z6841 Body Mass Index (BMI) 40.0 and over, adult: Secondary | ICD-10-CM | POA: Diagnosis not present

## 2017-04-20 DIAGNOSIS — K760 Fatty (change of) liver, not elsewhere classified: Secondary | ICD-10-CM | POA: Diagnosis not present

## 2017-04-20 DIAGNOSIS — F17210 Nicotine dependence, cigarettes, uncomplicated: Secondary | ICD-10-CM | POA: Diagnosis not present

## 2017-04-20 DIAGNOSIS — Z79899 Other long term (current) drug therapy: Secondary | ICD-10-CM | POA: Diagnosis not present

## 2017-04-20 DIAGNOSIS — K811 Chronic cholecystitis: Secondary | ICD-10-CM | POA: Diagnosis not present

## 2017-04-20 DIAGNOSIS — K828 Other specified diseases of gallbladder: Secondary | ICD-10-CM | POA: Diagnosis not present

## 2017-04-20 DIAGNOSIS — Z7982 Long term (current) use of aspirin: Secondary | ICD-10-CM | POA: Diagnosis not present

## 2017-04-20 DIAGNOSIS — E119 Type 2 diabetes mellitus without complications: Secondary | ICD-10-CM | POA: Diagnosis not present

## 2017-04-23 ENCOUNTER — Encounter
Admission: RE | Disposition: A | Discharge status: Home / Self Care | Payer: Self-pay | Source: Ambulatory Visit | Attending: Surgery

## 2017-04-23 ENCOUNTER — Ambulatory Visit: Payer: BLUE CROSS/BLUE SHIELD | Admitting: Certified Registered"

## 2017-04-23 ENCOUNTER — Ambulatory Visit
Admission: RE | Admit: 2017-04-23 | Discharge: 2017-04-23 | Disposition: A | Discharge status: Home / Self Care | Payer: BLUE CROSS/BLUE SHIELD | Source: Ambulatory Visit | Attending: Surgery | Admitting: Surgery

## 2017-04-23 ENCOUNTER — Other Ambulatory Visit: Payer: Self-pay

## 2017-04-23 DIAGNOSIS — Z79899 Other long term (current) drug therapy: Secondary | ICD-10-CM | POA: Insufficient documentation

## 2017-04-23 DIAGNOSIS — Z7984 Long term (current) use of oral hypoglycemic drugs: Secondary | ICD-10-CM | POA: Insufficient documentation

## 2017-04-23 DIAGNOSIS — K828 Other specified diseases of gallbladder: Secondary | ICD-10-CM | POA: Diagnosis not present

## 2017-04-23 DIAGNOSIS — Z7982 Long term (current) use of aspirin: Secondary | ICD-10-CM | POA: Insufficient documentation

## 2017-04-23 DIAGNOSIS — E119 Type 2 diabetes mellitus without complications: Secondary | ICD-10-CM | POA: Insufficient documentation

## 2017-04-23 DIAGNOSIS — F17210 Nicotine dependence, cigarettes, uncomplicated: Secondary | ICD-10-CM | POA: Insufficient documentation

## 2017-04-23 DIAGNOSIS — K811 Chronic cholecystitis: Secondary | ICD-10-CM | POA: Diagnosis not present

## 2017-04-23 DIAGNOSIS — I10 Essential (primary) hypertension: Secondary | ICD-10-CM | POA: Insufficient documentation

## 2017-04-23 DIAGNOSIS — K760 Fatty (change of) liver, not elsewhere classified: Secondary | ICD-10-CM | POA: Insufficient documentation

## 2017-04-23 DIAGNOSIS — Z6841 Body Mass Index (BMI) 40.0 and over, adult: Secondary | ICD-10-CM | POA: Insufficient documentation

## 2017-04-23 HISTORY — PX: ROBOTIC ASSISTED LAPAROSCOPIC CHOLECYSTECTOMY: SHX6521

## 2017-04-23 LAB — POCT PREGNANCY, URINE: Preg Test, Ur: NEGATIVE

## 2017-04-23 LAB — GLUCOSE, CAPILLARY
GLUCOSE-CAPILLARY: 140 mg/dL — AB (ref 65–99)
Glucose-Capillary: 228 mg/dL — ABNORMAL HIGH (ref 65–99)

## 2017-04-23 SURGERY — ROBOTIC ASSISTED LAPAROSCOPIC CHOLECYSTECTOMY
Anesthesia: General | Site: Abdomen | Wound class: Contaminated

## 2017-04-23 MED ORDER — RACEPINEPHRINE HCL 2.25 % IN NEBU
0.5000 mL | INHALATION_SOLUTION | Freq: Once | RESPIRATORY_TRACT | Status: AC
  Administered 2017-04-23: 0.5 mL via RESPIRATORY_TRACT

## 2017-04-23 MED ORDER — GLYCOPYRROLATE 0.2 MG/ML IJ SOLN
INTRAMUSCULAR | Status: AC
  Filled 2017-04-23: qty 1

## 2017-04-23 MED ORDER — GABAPENTIN 300 MG PO CAPS
ORAL_CAPSULE | ORAL | Status: AC
  Administered 2017-04-23: 300 mg via ORAL
  Filled 2017-04-23: qty 1

## 2017-04-23 MED ORDER — KETAMINE HCL 50 MG/ML IJ SOLN
INTRAMUSCULAR | Status: AC
Start: 2017-04-23 — End: ?
  Filled 2017-04-23: qty 10

## 2017-04-23 MED ORDER — MEPERIDINE HCL 50 MG/ML IJ SOLN: 6.2500 mg | INTRAMUSCULAR | Status: DC | PRN

## 2017-04-23 MED ORDER — ROCURONIUM BROMIDE 100 MG/10ML IV SOLN
INTRAVENOUS | Status: DC | PRN
  Administered 2017-04-23: 20 mg via INTRAVENOUS
  Administered 2017-04-23: 50 mg via INTRAVENOUS

## 2017-04-23 MED ORDER — KETOROLAC TROMETHAMINE 30 MG/ML IJ SOLN
INTRAMUSCULAR | Status: AC
  Filled 2017-04-23: qty 1

## 2017-04-23 MED ORDER — SODIUM CHLORIDE FLUSH 0.9 % IV SOLN
INTRAVENOUS | Status: AC
  Filled 2017-04-23: qty 10

## 2017-04-23 MED ORDER — INDOCYANINE GREEN 25 MG IV SOLR
INTRAVENOUS | Status: AC
  Filled 2017-04-23: qty 25

## 2017-04-23 MED ORDER — HYDROCODONE-ACETAMINOPHEN 7.5-325 MG PO TABS
1.0000 | ORAL_TABLET | Freq: Once | ORAL | Status: DC | PRN
Start: 2017-04-23 — End: 2017-04-23

## 2017-04-23 MED ORDER — HYDROCODONE-ACETAMINOPHEN 5-325 MG PO TABS: 1.0000 | ORAL_TABLET | ORAL | 0 refills | Status: DC | PRN

## 2017-04-23 MED ORDER — ACETAMINOPHEN 500 MG PO TABS
ORAL_TABLET | ORAL | Status: AC
  Administered 2017-04-23: 1000 mg via ORAL
  Filled 2017-04-23: qty 2

## 2017-04-23 MED ORDER — HEPARIN SODIUM (PORCINE) 5000 UNIT/ML IJ SOLN
5000.0000 [IU] | Freq: Once | INTRAMUSCULAR | Status: AC
  Administered 2017-04-23: 5000 [IU] via SUBCUTANEOUS

## 2017-04-23 MED ORDER — PROPOFOL 10 MG/ML IV BOLUS
INTRAVENOUS | Status: AC
  Filled 2017-04-23: qty 40

## 2017-04-23 MED ORDER — LIDOCAINE HCL (CARDIAC) 20 MG/ML IV SOLN
INTRAVENOUS | Status: DC | PRN
  Administered 2017-04-23: 60 mg via INTRAVENOUS

## 2017-04-23 MED ORDER — DEXAMETHASONE SODIUM PHOSPHATE 10 MG/ML IJ SOLN
INTRAMUSCULAR | Status: DC | PRN
  Administered 2017-04-23: 5 mg via INTRAVENOUS

## 2017-04-23 MED ORDER — MIDAZOLAM HCL 2 MG/2ML IJ SOLN
INTRAMUSCULAR | Status: AC
  Administered 2017-04-23: 1 mg
  Filled 2017-04-23: qty 2

## 2017-04-23 MED ORDER — LIDOCAINE HCL (PF) 2 % IJ SOLN
INTRAMUSCULAR | Status: AC
  Filled 2017-04-23: qty 10

## 2017-04-23 MED ORDER — KETOROLAC TROMETHAMINE 30 MG/ML IJ SOLN
INTRAMUSCULAR | Status: DC | PRN
  Administered 2017-04-23: 30 mg via INTRAVENOUS

## 2017-04-23 MED ORDER — CEFAZOLIN SODIUM-DEXTROSE 2-4 GM/100ML-% IV SOLN
INTRAVENOUS | Status: AC
  Filled 2017-04-23: qty 100

## 2017-04-23 MED ORDER — CHLORHEXIDINE GLUCONATE CLOTH 2 % EX PADS: 6.0000 | MEDICATED_PAD | Freq: Once | CUTANEOUS | Status: DC

## 2017-04-23 MED ORDER — CEFAZOLIN SODIUM-DEXTROSE 2-4 GM/100ML-% IV SOLN
2.0000 g | INTRAVENOUS | Status: AC
  Administered 2017-04-23: 2 g via INTRAVENOUS

## 2017-04-23 MED ORDER — KETAMINE HCL 10 MG/ML IJ SOLN
INTRAMUSCULAR | Status: DC | PRN
  Administered 2017-04-23 (×2): 30 mg via INTRAVENOUS

## 2017-04-23 MED ORDER — FAMOTIDINE 20 MG PO TABS
ORAL_TABLET | ORAL | Status: AC
  Administered 2017-04-23: 20 mg via ORAL
  Filled 2017-04-23: qty 1

## 2017-04-23 MED ORDER — ROCURONIUM BROMIDE 50 MG/5ML IV SOLN
INTRAVENOUS | Status: AC
Start: 2017-04-23 — End: ?
  Filled 2017-04-23: qty 1

## 2017-04-23 MED ORDER — PROPOFOL 10 MG/ML IV BOLUS
INTRAVENOUS | Status: DC | PRN
  Administered 2017-04-23: 200 mg via INTRAVENOUS

## 2017-04-23 MED ORDER — DEXAMETHASONE SODIUM PHOSPHATE 10 MG/ML IJ SOLN
INTRAMUSCULAR | Status: AC
  Filled 2017-04-23: qty 1

## 2017-04-23 MED ORDER — PROMETHAZINE HCL 25 MG/ML IJ SOLN
6.2500 mg | INTRAMUSCULAR | Status: DC | PRN
  Administered 2017-04-23: 12.5 mg via INTRAVENOUS

## 2017-04-23 MED ORDER — ONDANSETRON HCL 4 MG/2ML IJ SOLN
INTRAMUSCULAR | Status: AC
Start: 2017-04-23 — End: ?
  Filled 2017-04-23: qty 2

## 2017-04-23 MED ORDER — ACETAMINOPHEN 500 MG PO TABS
1000.0000 mg | ORAL_TABLET | ORAL | Status: AC
  Administered 2017-04-23: 1000 mg via ORAL

## 2017-04-23 MED ORDER — ONDANSETRON HCL 4 MG/2ML IJ SOLN
INTRAMUSCULAR | Status: DC | PRN
  Administered 2017-04-23: 4 mg via INTRAVENOUS

## 2017-04-23 MED ORDER — SUGAMMADEX SODIUM 500 MG/5ML IV SOLN
INTRAVENOUS | Status: AC
  Filled 2017-04-23: qty 5

## 2017-04-23 MED ORDER — FAMOTIDINE 20 MG PO TABS
20.0000 mg | ORAL_TABLET | Freq: Once | ORAL | Status: AC
  Administered 2017-04-23: 20 mg via ORAL

## 2017-04-23 MED ORDER — INDOCYANINE GREEN 25 MG IV SOLR
2.5000 mg | Freq: Once | INTRAVENOUS | Status: AC
  Administered 2017-04-23: 2.5 mg via INTRAVENOUS
  Filled 2017-04-23: qty 25

## 2017-04-23 MED ORDER — HYDROMORPHONE HCL 1 MG/ML IJ SOLN
INTRAMUSCULAR | Status: AC
  Filled 2017-04-23: qty 2

## 2017-04-23 MED ORDER — SODIUM CHLORIDE 0.9 % IV SOLN
INTRAVENOUS | Status: DC
  Administered 2017-04-23: 07:00:00 via INTRAVENOUS

## 2017-04-23 MED ORDER — SUGAMMADEX SODIUM 500 MG/5ML IV SOLN
INTRAVENOUS | Status: DC | PRN
  Administered 2017-04-23: 250 mg via INTRAVENOUS
  Administered 2017-04-23: 100 mg via INTRAVENOUS

## 2017-04-23 MED ORDER — BUPIVACAINE HCL (PF) 0.25 % IJ SOLN
INTRAMUSCULAR | Status: AC
  Filled 2017-04-23: qty 30

## 2017-04-23 MED ORDER — SEVOFLURANE IN SOLN
RESPIRATORY_TRACT | Status: AC
  Filled 2017-04-23: qty 250

## 2017-04-23 MED ORDER — HEPARIN SODIUM (PORCINE) 5000 UNIT/ML IJ SOLN
INTRAMUSCULAR | Status: AC
Start: 2017-04-23 — End: 2017-04-23
  Administered 2017-04-23: 5000 [IU] via SUBCUTANEOUS
  Filled 2017-04-23: qty 1

## 2017-04-23 MED ORDER — BUPIVACAINE HCL 0.25 % IJ SOLN
INTRAMUSCULAR | Status: DC | PRN
  Administered 2017-04-23: 30 mL

## 2017-04-23 MED ORDER — GLYCOPYRROLATE 0.2 MG/ML IJ SOLN
INTRAMUSCULAR | Status: DC | PRN
  Administered 2017-04-23: 0.1 mg via INTRAVENOUS

## 2017-04-23 MED ORDER — LACTATED RINGERS IV SOLN
INTRAVENOUS | Status: DC | PRN
  Administered 2017-04-23: 07:00:00 via INTRAVENOUS

## 2017-04-23 MED ORDER — HYDROMORPHONE HCL 1 MG/ML IJ SOLN
INTRAMUSCULAR | Status: DC | PRN
  Administered 2017-04-23 (×2): 1 mg via INTRAVENOUS

## 2017-04-23 MED ORDER — RACEPINEPHRINE HCL 2.25 % IN NEBU
INHALATION_SOLUTION | RESPIRATORY_TRACT | Status: AC
  Administered 2017-04-23: 0.5 mL via RESPIRATORY_TRACT
  Filled 2017-04-23: qty 0.5

## 2017-04-23 MED ORDER — MIDAZOLAM HCL 2 MG/2ML IJ SOLN
INTRAMUSCULAR | Status: DC | PRN
  Administered 2017-04-23: 2 mg via INTRAVENOUS

## 2017-04-23 MED ORDER — GABAPENTIN 300 MG PO CAPS
300.0000 mg | ORAL_CAPSULE | ORAL | Status: AC
  Administered 2017-04-23: 300 mg via ORAL

## 2017-04-23 MED ORDER — HYDROMORPHONE HCL 1 MG/ML IJ SOLN: 0.2500 mg | INTRAMUSCULAR | Status: DC | PRN

## 2017-04-23 MED ORDER — PROMETHAZINE HCL 25 MG/ML IJ SOLN
INTRAMUSCULAR | Status: AC
  Administered 2017-04-23: 12.5 mg via INTRAVENOUS
  Filled 2017-04-23: qty 1

## 2017-04-23 MED ORDER — MIDAZOLAM HCL 2 MG/2ML IJ SOLN
INTRAMUSCULAR | Status: AC
  Filled 2017-04-23: qty 4

## 2017-04-23 SURGICAL SUPPLY — 44 items
APPLIER CLIP ROT 10 11.4 M/L (STAPLE)
CANISTER SUCT 1200ML W/VALVE (MISCELLANEOUS) ×3 IMPLANT
CANNULA SEALS 8.5MM (CANNULA) ×2
CHLORAPREP W/TINT 26ML (MISCELLANEOUS) ×3 IMPLANT
CLIP APPLIE ROT 10 11.4 M/L (STAPLE) IMPLANT
CLIP VESOLOCK MED LG 6/CT (CLIP) ×6 IMPLANT
CORD BIP STRL DISP 12FT (MISCELLANEOUS) ×3 IMPLANT
COVER TIP SHEARS 8 DVNC (MISCELLANEOUS) ×1 IMPLANT
COVER TIP SHEARS 8MM DA VINCI (MISCELLANEOUS) ×2
DECANTER SPIKE VIAL GLASS SM (MISCELLANEOUS) ×3 IMPLANT
DEFOGGER SCOPE WARMER CLEARIFY (MISCELLANEOUS) ×3 IMPLANT
DERMABOND ADVANCED (GAUZE/BANDAGES/DRESSINGS) ×2
DERMABOND ADVANCED .7 DNX12 (GAUZE/BANDAGES/DRESSINGS) ×1 IMPLANT
DRAPE 3 ARM ACCESS DA VINCI (DRAPES) ×2
DRAPE 3 ARM ACCESS DVNC (DRAPES) ×1 IMPLANT
DRAPE SHEET LG 3/4 BI-LAMINATE (DRAPES) ×6 IMPLANT
DRSG TEGADERM 2-3/8X2-3/4 SM (GAUZE/BANDAGES/DRESSINGS) IMPLANT
DRSG TELFA 3X8 NADH (GAUZE/BANDAGES/DRESSINGS) IMPLANT
ELECT REM PT RETURN 9FT ADLT (ELECTROSURGICAL) ×3
ELECTRODE REM PT RTRN 9FT ADLT (ELECTROSURGICAL) ×1 IMPLANT
GLOVE BIOGEL M 7.0 STRL (GLOVE) ×12 IMPLANT
GOWN STRL REUS W/ TWL LRG LVL3 (GOWN DISPOSABLE) ×3 IMPLANT
GOWN STRL REUS W/TWL LRG LVL3 (GOWN DISPOSABLE) ×6
IRRIGATION STRYKERFLOW (MISCELLANEOUS) ×1 IMPLANT
IRRIGATOR STRYKERFLOW (MISCELLANEOUS) ×3
IV NS 1000ML (IV SOLUTION) ×2
IV NS 1000ML BAXH (IV SOLUTION) ×1 IMPLANT
KIT PINK PAD W/HEAD ARE REST (MISCELLANEOUS) ×3
KIT PINK PAD W/HEAD ARM REST (MISCELLANEOUS) ×1 IMPLANT
LABEL OR SOLS (LABEL) IMPLANT
NDL HPO THNWL 1X22GA REG BVL (NEEDLE) ×1 IMPLANT
NEEDLE HYPO 25X1 1.5 SAFETY (NEEDLE) IMPLANT
NEEDLE SAFETY 22GX1 (NEEDLE) ×2
NS IRRIG 500ML POUR BTL (IV SOLUTION) ×3 IMPLANT
PACK LAP CHOLECYSTECTOMY (MISCELLANEOUS) ×3 IMPLANT
POUCH ENDO CATCH 10MM SPEC (MISCELLANEOUS) IMPLANT
POUCH SPECIMEN RETRIEVAL 10MM (ENDOMECHANICALS) ×3 IMPLANT
SEAL CANN 8.5 DVNC (CANNULA) ×1 IMPLANT
SOLUTION ELECTROLUBE (MISCELLANEOUS) ×3 IMPLANT
STRAP SAFETY 5IN WIDE (MISCELLANEOUS) ×3 IMPLANT
SUT MNCRL AB 4-0 PS2 18 (SUTURE) ×3 IMPLANT
SUT VICRYL 0 AB UR-6 (SUTURE) ×6 IMPLANT
TROCAR KII 11MM THR (TROCAR) ×3 IMPLANT
TUBING INSUFFLATOR HEATED (MISCELLANEOUS) ×3 IMPLANT

## 2017-04-23 NOTE — Anesthesia Preprocedure Evaluation (Signed)
Anesthesia Evaluation  Patient identified by MRN, date of birth, ID band Patient awake    Reviewed: Allergy & Precautions, H&P , NPO status , reviewed documented beta blocker date and time   History of Anesthesia Complications Negative for: history of anesthetic complications  Airway Mallampati: II  TM Distance: >3 FB     Dental no notable dental hx. (+) Teeth Intact   Pulmonary Current Smoker,    Pulmonary exam normal        Cardiovascular hypertension, Normal cardiovascular exam     Neuro/Psych PSYCHIATRIC DISORDERS Anxiety Depression negative neurological ROS     GI/Hepatic negative GI ROS, Neg liver ROS, neg GERD  ,  Endo/Other  diabetes, Type 2Morbid obesity  Renal/GU   negative genitourinary   Musculoskeletal   Abdominal   Peds negative pediatric ROS (+)  Hematology negative hematology ROS (+)   Anesthesia Other Findings   Reproductive/Obstetrics                             Anesthesia Physical Anesthesia Plan  ASA: III  Anesthesia Plan: General ETT   Post-op Pain Management:    Induction:   PONV Risk Score and Plan: 3 and Ondansetron, Midazolam and Dexamethasone  Airway Management Planned:   Additional Equipment:   Intra-op Plan:   Post-operative Plan:   Informed Consent: I have reviewed the patients History and Physical, chart, labs and discussed the procedure including the risks, benefits and alternatives for the proposed anesthesia with the patient or authorized representative who has indicated his/her understanding and acceptance.   Dental Advisory Given  Plan Discussed with: CRNA  Anesthesia Plan Comments:         Anesthesia Quick Evaluation

## 2017-04-23 NOTE — Anesthesia Post-op Follow-up Note (Signed)
Anesthesia QCDR form completed.        

## 2017-04-23 NOTE — Transfer of Care (Signed)
Immediate Anesthesia Transfer of Care Note  Patient: Carmen [name]  Procedure(s) Performed: ROBOTIC ASSISTED LAPAROSCOPIC CHOLECYSTECTOMY (N/A Abdomen)  Patient Location: PACU  Anesthesia Type:General  Level of Consciousness: drowsy and patient cooperative  Airway & Oxygen Therapy: Patient Spontanous Breathing and Patient connected to face mask oxygen  Post-op Assessment: Report given to RN, Post -op Vital signs reviewed and stable and Patient moving all extremities X 4  Post vital signs: Reviewed and stable  Last Vitals:  Vitals:   04/23/17 0614 04/23/17 0623  BP: 113/60   Pulse: 88   Resp:  18  Temp: 36.7 C   SpO2: 100%     Last Pain:  Vitals:   04/23/17 0614  TempSrc: Oral         Complications: No apparent anesthesia complications

## 2017-04-23 NOTE — Discharge Instructions (Signed)
General Anesthesia, Adult, Care After °These instructions provide you with information about caring for yourself after your procedure. Your health care provider may also give you more specific instructions. Your treatment has been planned according to current medical practices, but problems sometimes occur. Call your health care provider if you have any problems or questions after your procedure. °What can I expect after the procedure? °After the procedure, it is common to have: °· Vomiting. °· A sore throat. °· Mental slowness. ° °It is common to feel: °· Nauseous. °· Cold or shivery. °· Sleepy. °· Tired. °· Sore or achy, even in parts of your body where you did not have surgery. ° °Follow these instructions at home: °For at least 24 hours after the procedure: °· Do not: °? Participate in activities where you could fall or become injured. °? Drive. °? Use heavy machinery. °? Drink alcohol. °? Take sleeping pills or medicines that cause drowsiness. °? Make important decisions or sign legal documents. °? Take care of children on your own. °· Rest. °Eating and drinking °· If you vomit, drink water, juice, or soup when you can drink without vomiting. °· Drink enough fluid to keep your urine clear or pale yellow. °· Make sure you have little or no nausea before eating solid foods. °· Follow the diet recommended by your health care provider. °General instructions °· Have a responsible adult stay with you until you are awake and alert. °· Return to your normal activities as told by your health care provider. Ask your health care provider what activities are safe for you. °· Take over-the-counter and prescription medicines only as told by your health care provider. °· If you smoke, do not smoke without supervision. °· Keep all follow-up visits as told by your health care provider. This is important. °Contact a health care provider if: °· You continue to have nausea or vomiting at home, and medicines are not helpful. °· You  cannot drink fluids or start eating again. °· You cannot urinate after 8-12 hours. °· You develop a skin rash. °· You have fever. °· You have increasing redness at the site of your procedure. °Get help right away if: °· You have difficulty breathing. °· You have chest pain. °· You have unexpected bleeding. °· You feel that you are having a life-threatening or urgent problem. °This information is not intended to replace advice given to you by your health care provider. Make sure you discuss any questions you have with your health care provider. °Document Released: 05/05/2000 Document Revised: 07/02/2015 Document Reviewed: 01/11/2015 °Elsevier Interactive Patient Education © 2018 [employer] ° °

## 2017-04-23 NOTE — Op Note (Signed)
Robotic Assisted Laparoscopic Cholecystectomy  Pre-operative Diagnosis: Biliary Dyskinesia  Post-operative Diagnosis: same  Procedure: Robotic Assisted Laparoscopic Cholecystectomy  Surgeon: Caroleen Hamman, MD FACS  Anesthesia: Gen. with endotracheal tube  Findings:  Normal biliary anatomy, no evidence of bile leak by ICG cholangiography  Estimated Blood Loss: 5cc                Specimens: Gallbladder           Complications: none  Procedure Details  The patient was seen again in the Holding Room. The benefits, complications, treatment options, and expected outcomes were discussed with the patient. The risks of bleeding, infection, recurrence of symptoms, failure to resolve symptoms, bile duct damage, bile duct leak, retained common bile duct stone, bowel injury, any of which could require further surgery and/or ERCP, stent, or papillotomy were reviewed with the patient. The likelihood of improving the patient's symptoms with return to their baseline status is good.  The patient and/or family concurred with the proposed plan, giving informed consent.  The patient was taken to Operating Room, identified as Carmen [name] and the procedure verified as Laparoscopic Cholecystectomy.  A Time Out was held and the above information confirmed.  Prior to the induction of general anesthesia, antibiotic prophylaxis was administered. VTE prophylaxis was in place. General endotracheal anesthesia was then administered and tolerated well. After the induction, the abdomen was prepped with Chloraprep and draped in the sterile fashion. The patient was positioned in the supine position.  Cut down technique was used to enter the abdominal cavity and a Hasson trochar was placed after two vicryl stitches were anchored to the fascia. Pneumoperitoneum was then created with CO2 and tolerated well without any adverse changes in the patient's vital signs.   Two 8-mm ports were placed in the right upper quadrant  And   In theleft upper quadrant all under direct vision. An additional 5 mm port was place RUQ.  All skin incisions  were infiltrated with a local anesthetic agent before making the incision and placing the trocars.   The patient was positioned  in reverse Trendelenburg, tilted slightly to the patient's left.   The Robotic arms were  Brought into the field and the robot was docked in the standard fashion. Robotics instruments brought to the operating field under direct vision. I scrubbed out and sit on the console.  The gallbladder was identified, the fundus grasped and retracted cephalad. Adhesions were lysed bluntly. The infundibulum was grasped and retracted laterally, exposing the peritoneum overlying the triangle of Calot. This was then divided and exposed in a blunt fashion. An extended critical view of the cystic duct and cystic artery was obtained.  The cystic duct was clearly identified and bluntly dissected.   Artery and duct were double clipped and divided. ICG cholangiography showed normal biliary anatomy with no evidence of bile leak or injury to the CBD.  The gallbladder was taken from the gallbladder fossa in a retrograde fashion with the electrocautery.  Hemostasis was achieved with the electrocautery.   The robot was undock.  Copious irrigation was utilized and was repeatedly aspirated until clear.     Inspection of the right upper quadrant was performed. No bleeding, bile duct injury or leak, or bowel injury was noted. The gallbladder and Endocatch sac were then removed through a port site. Pneumoperitoneum was released.  The periumbilical port site was closed with interrumpted 0 Vicryl sutures. 4-0 subcuticular Monocryl was used to close the skin. Dermabond was  applied.  The patient was then extubated and brought to the recovery room in stable condition. Sponge, lap, and needle counts were correct at closure and at the conclusion of the case.               Caroleen Hamman, MD,  FACS

## 2017-04-23 NOTE — Anesthesia Procedure Notes (Signed)
Procedure Name: Intubation Date/Time: 04/23/2017 7:52 AM Performed by: Nile Riggs, CRNA Pre-anesthesia Checklist: Patient identified, Emergency Drugs available, Suction available, Patient being monitored and Timeout performed Patient Re-evaluated:Patient Re-evaluated prior to induction Oxygen Delivery Method: Circle system utilized Preoxygenation: Pre-oxygenation with 100% oxygen Induction Type: IV induction Ventilation: Mask ventilation without difficulty and Oral airway inserted - appropriate to patient size Laryngoscope Size: Mac and 3 Grade View: Grade I Tube type: Oral Tube size: 7.5 mm Number of attempts: 1 Airway Equipment and Method: Stylet Placement Confirmation: ETT inserted through vocal cords under direct vision,  positive ETCO2 and breath sounds checked- equal and bilateral Secured at: 21 cm Tube secured with: Tape Dental Injury: Teeth and Oropharynx as per pre-operative assessment

## 2017-04-23 NOTE — Interval H&P Note (Signed)
History and Physical Interval Note:  04/23/2017 7:19 AM  Carmen [name]  has presented today for surgery, with the diagnosis of biliary dyskinesia  The various methods of treatment have been discussed with the patient and family. After consideration of risks, benefits and other options for treatment, the patient has consented to  Procedure(s): ROBOTIC ASSISTED LAPAROSCOPIC CHOLECYSTECTOMY (N/A) as a surgical intervention .  The patient's history has been reviewed, patient examined, no change in status, stable for surgery.  I have reviewed the patient's chart and labs.  Questions were answered to the patient's satisfaction.     [place]

## 2017-04-24 LAB — SURGICAL PATHOLOGY

## 2017-04-24 NOTE — Anesthesia Postprocedure Evaluation (Signed)
Anesthesia Post Note  Patient: Carmen [name]  Procedure(s) Performed: ROBOTIC ASSISTED LAPAROSCOPIC CHOLECYSTECTOMY (N/A Abdomen)  Patient location during evaluation: PACU Anesthesia Type: General Level of consciousness: awake and alert Pain management: pain level controlled Vital Signs Assessment: post-procedure vital signs reviewed and stable Respiratory status: spontaneous breathing, nonlabored ventilation and respiratory function stable Cardiovascular status: blood pressure returned to baseline and stable Postop Assessment: no apparent nausea or vomiting Anesthetic complications: no     Last Vitals:  Vitals:   04/23/17 1243 04/23/17 1334  BP: 101/68 103/72  Pulse: 83 92  Resp: 12 12  Temp:    SpO2: 93% 94%    Last Pain:  Vitals:   04/23/17 1334  TempSrc:   PainSc: 0-No pain                 Alphonsus Sias

## 2017-05-07 ENCOUNTER — Encounter: Payer: Self-pay | Admitting: Surgery

## 2017-05-07 ENCOUNTER — Ambulatory Visit (INDEPENDENT_AMBULATORY_CARE_PROVIDER_SITE_OTHER): Payer: BLUE CROSS/BLUE SHIELD | Admitting: Surgery

## 2017-05-07 VITALS — BP 148/91 | HR 78 | Temp 98.1°F | Resp 14 | Ht 59.0 in | Wt 251.0 lb

## 2017-05-07 DIAGNOSIS — Z09 Encounter for follow-up examination after completed treatment for conditions other than malignant neoplasm: Secondary | ICD-10-CM

## 2017-05-07 NOTE — Progress Notes (Signed)
S/p robt lap chole Doing very well Minimal pain No fevers + Po Path d/w pt chronic cholecystitis  PE NAd Abd: incisions c/d/i , no infection  A/ Doing very well No heavy lifting RTC prn

## 2017-05-07 NOTE — Patient Instructions (Signed)
Follow up here as needed

## 2017-09-24 ENCOUNTER — Encounter: Payer: Self-pay | Admitting: Gastroenterology

## 2017-09-24 ENCOUNTER — Ambulatory Visit: Payer: BLUE CROSS/BLUE SHIELD | Admitting: Gastroenterology

## 2017-09-24 VITALS — BP 103/71 | HR 76 | Ht 59.0 in | Wt 255.6 lb

## 2017-09-24 DIAGNOSIS — R935 Abnormal findings on diagnostic imaging of other abdominal regions, including retroperitoneum: Secondary | ICD-10-CM

## 2017-09-24 DIAGNOSIS — Z800 Family history of malignant neoplasm of digestive organs: Secondary | ICD-10-CM

## 2017-09-24 NOTE — Progress Notes (Signed)
[name] MD, MRCP(U.K) [address]  [place]  [place], [place] [address]  Main: [phone]  Fax: [phone]   Primary Care Physician: Inge Rise, NP  Primary Gastroenterologist:  Dr. [name]   No chief complaint on file.   HPI: Carmen [name] is a 42 y.o. female   Summary of history : Carmen C Corbettis here today to see me for "white stool"  Previously seen back in 03/2017 for abdominal pain . She was seen at the ER on 02/11/2017 when she presented with abdominal pain began the day prior. It was a associated with one episode of vomiting. No other lower GI symptoms. She underwent a CT scan of the abdomen and pelvis which demonstrated minimal soft tissue inflammation around the head of the pancreas. Mild soft tissue inflammation around the second segment of the duodenum which could reflect mild duodenitis. Underlying ulceration cannot be excluded. 3.5 cm right adnexal cystic lesion. She underwent a right upper quadrant ultrasound which demonstrated no acute abnormality mild fatty infiltration of the liver. Common bile duct is 3 mm. No gallstones were seen. Lab results suggested a normal lipase of 19 CMP demonstrated an elevated glucose otherwise her transaminases, alkaline phosphatase, total bilirubin were normal. Creatinine was 0.93. Hemoglobin of 13.2 g. Urine analysis showed no evidence of infection. And pregnancy test was negative.  EGD 02/2017 : gastric bx-normal , EGD no abnormal findings.  H pylori stool antigen negative.  HIDA scan , low EF of gall bladder.   Interval history2/19/19-8/15/19   She underwent a cholecystectomy earlier this year in 04/2017 . Pathology report showed chronic cholecystitis.   Says her stools were white on one occasion a few days back , had a bit of constipation previously which has all resolved. Presently all her symptoms have resolved. No abdominal pain.   Sister died from colon cancer. Her last colonoscopy was>5  years.    Current Outpatient Medications  Medication Sig Dispense Refill  . aspirin EC 81 MG tablet Take 81 mg by mouth daily.     Marland Kitchen atorvastatin (LIPITOR) 20 MG tablet Take 20 mg by mouth every evening.     Marland Kitchen lisinopril (PRINIVIL,ZESTRIL) 10 MG tablet Take 10 mg by mouth every evening.     . metFORMIN (GLUCOPHAGE) 1000 MG tablet Take 1,000 mg by mouth daily with breakfast. Takes 2000 mg once daily     No current facility-administered medications for this visit.     Allergies as of 09/24/2017  . (No Known Allergies)    ROS:  General: Negative for anorexia, weight loss, fever, chills, fatigue, weakness. ENT: Negative for hoarseness, difficulty swallowing , nasal congestion. CV: Negative for chest pain, angina, palpitations, dyspnea on exertion, peripheral edema.  Respiratory: Negative for dyspnea at rest, dyspnea on exertion, cough, sputum, wheezing.  GI: See history of present illness. GU:  Negative for dysuria, hematuria, urinary incontinence, urinary frequency, nocturnal urination.  Endo: Negative for unusual weight change.    Physical Examination:   There were no vitals taken for this visit.  General: Well-nourished, well-developed in no acute distress.  Eyes: No icterus. Conjunctivae pink. Mouth: Oropharyngeal mucosa moist and pink , no lesions erythema or exudate. Lungs: Clear to auscultation bilaterally. Non-labored. Heart: Regular rate and rhythm, no murmurs rubs or gallops.  Abdomen: Bowel sounds are normal, nontender, nondistended, no hepatosplenomegaly or masses, no abdominal bruits or hernia , no rebound or guarding.   Extremities: No lower extremity edema. No clubbing or deformities. Neuro: Alert and  oriented x 3.  Grossly intact. Skin: Warm and dry, no jaundice.   Psych: Alert and cooperative, normal mood and affect.   Imaging Studies: No results found.  Assessment and Plan:   IRISA GRIMSLEY is a 42 y.o. y/o female  here to see me for "white stools " ,  seen in the past for abdominal pain. A CT scan of the abdomen showed fluid around the pancreas which can sometimes could be suggestive of pancreatitis but her lipase was normal and her abdominal pain was not typical of pancreatitis. White stools can occur in the setting of biliary obstruction , use of anti diarrheal meds. Family history of colon cancer- she is due for screening .   Plan:  1.CT abdomen  evaluate the pancreas which appeared abnormal in 02/2017  2. LFT's 3. Avoid any pepto bismol as it can cause white stool 4. Colonoscopy    I have discussed alternative options, risks & benefits,  which include, but are not limited to, bleeding, infection, perforation,respiratory complication & drug reaction.  The patient agrees with this plan & written consent will be obtained.     Dr [name]  MD,MRCP [facility]) Follow up PRN

## 2017-09-24 NOTE — Progress Notes (Signed)
Pt to be scheduled for: 1- colonoscopy for screening and FHX colon cancer 2- CT upper abdomin for abnormal CT 02/2017 (fluid around the pancreas). 3- Liver Function test.  Pt will have to contact office when she can have this done due to her work schedule.

## 2017-11-04 ENCOUNTER — Telehealth: Payer: Self-pay | Admitting: Gastroenterology

## 2017-11-04 NOTE — Telephone Encounter (Signed)
Pt would like to schel colon test

## 2017-11-04 NOTE — Telephone Encounter (Signed)
Pt would like to schel colon test. Pls call patient.

## 2017-11-05 ENCOUNTER — Telehealth: Payer: Self-pay | Admitting: Gastroenterology

## 2017-11-05 ENCOUNTER — Other Ambulatory Visit: Payer: Self-pay

## 2017-11-05 DIAGNOSIS — Z800 Family history of malignant neoplasm of digestive organs: Secondary | ICD-10-CM

## 2017-11-05 DIAGNOSIS — R935 Abnormal findings on diagnostic imaging of other abdominal regions, including retroperitoneum: Secondary | ICD-10-CM

## 2017-11-05 NOTE — Telephone Encounter (Signed)
Returned pt call regarding scheduling colonoscopy. LVM to return call

## 2017-11-05 NOTE — Telephone Encounter (Signed)
Pt has been contacted and procedure has been scheduled.

## 2017-11-05 NOTE — Telephone Encounter (Signed)
Spoke with pt regarding scheduling colonoscopy procedure. Pt chose 11-16-17 for procedure.  I recited the procedure prep instructions and informed pt that printed instructions will be mailed to her address.

## 2017-11-05 NOTE — Telephone Encounter (Signed)
Pt called again to schel colon test. Says she need is asap

## 2017-11-16 ENCOUNTER — Ambulatory Visit
Admission: RE | Admit: 2017-11-16 | Discharge: 2017-11-16 | Disposition: A | Discharge status: Home / Self Care | Payer: BLUE CROSS/BLUE SHIELD | Source: Ambulatory Visit | Attending: Gastroenterology | Admitting: Gastroenterology

## 2017-11-16 ENCOUNTER — Telehealth: Payer: Self-pay

## 2017-11-16 ENCOUNTER — Encounter
Admission: RE | Disposition: A | Discharge status: Home / Self Care | Payer: Self-pay | Source: Ambulatory Visit | Attending: Gastroenterology

## 2017-11-16 ENCOUNTER — Encounter: Payer: Self-pay | Admitting: Anesthesiology

## 2017-11-16 ENCOUNTER — Encounter: Payer: Self-pay | Admitting: *Deleted

## 2017-11-16 ENCOUNTER — Other Ambulatory Visit: Payer: Self-pay

## 2017-11-16 DIAGNOSIS — R935 Abnormal findings on diagnostic imaging of other abdominal regions, including retroperitoneum: Secondary | ICD-10-CM

## 2017-11-16 DIAGNOSIS — Z800 Family history of malignant neoplasm of digestive organs: Secondary | ICD-10-CM

## 2017-11-16 SURGERY — COLONOSCOPY WITH PROPOFOL
Anesthesia: General

## 2017-11-16 MED ORDER — SODIUM CHLORIDE 0.9 % IV SOLN: INTRAVENOUS | Status: DC

## 2017-11-16 MED ORDER — PEG 3350-KCL-NA BICARB-NACL 420 G PO SOLR: 4000.0000 mL | Freq: Once | ORAL | 0 refills | Status: AC

## 2017-11-16 NOTE — Telephone Encounter (Signed)
-----   Message from [name], MD sent at 11/16/2017 11:44 AM EDT ----- Carmen [name]/michelle    She was not clean today - rescheduled colon for tomorrow please call in a prep again please

## 2017-11-16 NOTE — Telephone Encounter (Signed)
Spoke with pt and informed her that we have sent Golytely bowel prep to her preferred pharmacy. Pt is aware of Golytely bowel prep instructions.

## 2017-11-16 NOTE — Anesthesia Preprocedure Evaluation (Deleted)
Anesthesia Evaluation  Patient identified by MRN, date of birth, ID band Patient awake    Reviewed: Allergy & Precautions, H&P , NPO status , Patient's Chart, lab work & pertinent test results  Airway        Dental   Pulmonary Current Smoker,           Cardiovascular hypertension,      Neuro/Psych PSYCHIATRIC DISORDERS Anxiety Depression negative neurological ROS  negative psych ROS   GI/Hepatic negative GI ROS, Neg liver ROS,   Endo/Other  diabetesMorbid obesity  Renal/GU negative Renal ROS  negative genitourinary   Musculoskeletal   Abdominal   Peds  Hematology negative hematology ROS (+)   Anesthesia Other Findings Past Medical History: No date: Anxiety     Comment:  NO MEDS No date: Depression     Comment:  NO MEDS No date: Diabetes mellitus without complication (HCC) No date: High cholesterol No date: Hypertension No date: Morbid obesity ([place])  Past Surgical History: No date: COLONOSCOPY WITH PROPOFOL 02/17/2017: ESOPHAGOGASTRODUODENOSCOPY (EGD) WITH PROPOFOL; N/A     Comment:  Procedure: ESOPHAGOGASTRODUODENOSCOPY (EGD) WITH               PROPOFOL;  Surgeon: [name], MD;  Location: [facility]               ENDOSCOPY;  Service: Gastroenterology;  Laterality: N/A; 04/23/2017: ROBOTIC ASSISTED LAPAROSCOPIC CHOLECYSTECTOMY; N/A     Comment:  Procedure: ROBOTIC ASSISTED LAPAROSCOPIC               CHOLECYSTECTOMY;  Surgeon: Jules Husbands, MD;  Location:              [facility] ORS;  Service: General;  Laterality: N/A;     Reproductive/Obstetrics negative OB ROS                             Anesthesia Physical Anesthesia Plan  ASA:   Anesthesia Plan: General   Post-op Pain Management:    Induction:   PONV Risk Score and Plan: Propofol infusion and TIVA  Airway Management Planned:   Additional Equipment:   Intra-op Plan:   Post-operative Plan:   Informed Consent: I have  reviewed the patients History and Physical, chart, labs and discussed the procedure including the risks, benefits and alternatives for the proposed anesthesia with the patient or authorized representative who has indicated his/her understanding and acceptance.   Dental Advisory Given  Plan Discussed with: Anesthesiologist, CRNA and Surgeon  Anesthesia Plan Comments:         Anesthesia Quick Evaluation

## 2017-11-17 ENCOUNTER — Ambulatory Visit
Admission: RE | Admit: 2017-11-17 | Discharge: 2017-11-17 | Disposition: A | Discharge status: Home / Self Care | Payer: BLUE CROSS/BLUE SHIELD | Source: Ambulatory Visit | Attending: Gastroenterology | Admitting: Gastroenterology

## 2017-11-17 ENCOUNTER — Encounter
Admission: RE | Disposition: A | Discharge status: Home / Self Care | Payer: Self-pay | Source: Ambulatory Visit | Attending: Gastroenterology

## 2017-11-17 ENCOUNTER — Ambulatory Visit: Payer: BLUE CROSS/BLUE SHIELD | Admitting: Anesthesiology

## 2017-11-17 ENCOUNTER — Encounter: Payer: Self-pay | Admitting: Anesthesiology

## 2017-11-17 DIAGNOSIS — Z6841 Body Mass Index (BMI) 40.0 and over, adult: Secondary | ICD-10-CM | POA: Diagnosis not present

## 2017-11-17 DIAGNOSIS — D122 Benign neoplasm of ascending colon: Secondary | ICD-10-CM | POA: Insufficient documentation

## 2017-11-17 DIAGNOSIS — E7800 Pure hypercholesterolemia, unspecified: Secondary | ICD-10-CM | POA: Diagnosis not present

## 2017-11-17 DIAGNOSIS — F329 Major depressive disorder, single episode, unspecified: Secondary | ICD-10-CM | POA: Insufficient documentation

## 2017-11-17 DIAGNOSIS — E119 Type 2 diabetes mellitus without complications: Secondary | ICD-10-CM | POA: Insufficient documentation

## 2017-11-17 DIAGNOSIS — Z9049 Acquired absence of other specified parts of digestive tract: Secondary | ICD-10-CM | POA: Insufficient documentation

## 2017-11-17 DIAGNOSIS — I10 Essential (primary) hypertension: Secondary | ICD-10-CM | POA: Diagnosis not present

## 2017-11-17 DIAGNOSIS — Z800 Family history of malignant neoplasm of digestive organs: Secondary | ICD-10-CM | POA: Diagnosis not present

## 2017-11-17 DIAGNOSIS — Z7984 Long term (current) use of oral hypoglycemic drugs: Secondary | ICD-10-CM | POA: Diagnosis not present

## 2017-11-17 DIAGNOSIS — D124 Benign neoplasm of descending colon: Secondary | ICD-10-CM | POA: Insufficient documentation

## 2017-11-17 DIAGNOSIS — Z7982 Long term (current) use of aspirin: Secondary | ICD-10-CM | POA: Insufficient documentation

## 2017-11-17 DIAGNOSIS — Z79899 Other long term (current) drug therapy: Secondary | ICD-10-CM | POA: Diagnosis not present

## 2017-11-17 DIAGNOSIS — Z1211 Encounter for screening for malignant neoplasm of colon: Secondary | ICD-10-CM | POA: Diagnosis not present

## 2017-11-17 DIAGNOSIS — F419 Anxiety disorder, unspecified: Secondary | ICD-10-CM | POA: Diagnosis not present

## 2017-11-17 DIAGNOSIS — F17210 Nicotine dependence, cigarettes, uncomplicated: Secondary | ICD-10-CM | POA: Diagnosis not present

## 2017-11-17 HISTORY — PX: COLONOSCOPY WITH PROPOFOL: SHX5780

## 2017-11-17 LAB — POCT PREGNANCY, URINE: PREG TEST UR: NEGATIVE

## 2017-11-17 LAB — GLUCOSE, CAPILLARY: Glucose-Capillary: 222 mg/dL — ABNORMAL HIGH (ref 70–99)

## 2017-11-17 SURGERY — COLONOSCOPY WITH PROPOFOL
Anesthesia: General

## 2017-11-17 MED ORDER — MIDAZOLAM HCL 5 MG/5ML IJ SOLN
INTRAMUSCULAR | Status: DC | PRN
  Administered 2017-11-17: 2 mg via INTRAVENOUS

## 2017-11-17 MED ORDER — PROPOFOL 500 MG/50ML IV EMUL
INTRAVENOUS | Status: AC
  Filled 2017-11-17: qty 50

## 2017-11-17 MED ORDER — LIDOCAINE HCL (PF) 1 % IJ SOLN
INTRAMUSCULAR | Status: AC
  Administered 2017-11-17: 0.3 mL
  Filled 2017-11-17: qty 2

## 2017-11-17 MED ORDER — MIDAZOLAM HCL 2 MG/2ML IJ SOLN
INTRAMUSCULAR | Status: AC
  Filled 2017-11-17: qty 2

## 2017-11-17 MED ORDER — SODIUM CHLORIDE 0.9 % IV SOLN
INTRAVENOUS | Status: DC
  Administered 2017-11-17: 1000 mL via INTRAVENOUS

## 2017-11-17 MED ORDER — PROPOFOL 10 MG/ML IV BOLUS
INTRAVENOUS | Status: DC | PRN
  Administered 2017-11-17: 70 mg via INTRAVENOUS

## 2017-11-17 MED ORDER — PROPOFOL 500 MG/50ML IV EMUL
INTRAVENOUS | Status: DC | PRN
  Administered 2017-11-17: 120 ug/kg/min via INTRAVENOUS

## 2017-11-17 NOTE — Anesthesia Post-op Follow-up Note (Signed)
Anesthesia QCDR form completed.        

## 2017-11-17 NOTE — H&P (Signed)
[name], MD [address] [place], [place], [place], [address] [address], [place], [place], [place], [address] Phone: [phone]  Fax: [phone]  Primary Care Physician:  Inge Rise, NP   Pre-Procedure History & Physical: HPI:  Carmen [name] is a 42 y.o. female is here for an colonoscopy.   Past Medical History:  Diagnosis Date  . Anxiety    NO MEDS  . Depression    NO MEDS  . Diabetes mellitus without complication ([place])   . High cholesterol   . Hypertension   . Morbid obesity ([place])     Past Surgical History:  Procedure Laterality Date  . CHOLECYSTECTOMY    . COLONOSCOPY WITH PROPOFOL    . ESOPHAGOGASTRODUODENOSCOPY (EGD) WITH PROPOFOL N/A 02/17/2017   Procedure: ESOPHAGOGASTRODUODENOSCOPY (EGD) WITH PROPOFOL;  Surgeon: [name], MD;  Location: [facility] ENDOSCOPY;  Service: Gastroenterology;  Laterality: N/A;  . ROBOTIC ASSISTED LAPAROSCOPIC CHOLECYSTECTOMY N/A 04/23/2017   Procedure: ROBOTIC ASSISTED LAPAROSCOPIC CHOLECYSTECTOMY;  Surgeon: Jules Husbands, MD;  Location: [facility] ORS;  Service: General;  Laterality: N/A;    Prior to Admission medications   Medication Sig Start Date End Date Taking? Authorizing Provider  aspirin EC 81 MG tablet Take 81 mg by mouth daily.  03/18/17  Yes [provider]  atorvastatin (LIPITOR) 20 MG tablet Take 20 mg by mouth every evening.  03/18/17   [provider]  lisinopril (PRINIVIL,ZESTRIL) 10 MG tablet Take 10 mg by mouth every evening.  01/05/17   [provider]  metFORMIN (GLUCOPHAGE) 1000 MG tablet Take 1,000 mg by mouth daily with breakfast. Takes 2000 mg once daily    [provider]    Allergies as of 11/16/2017  . (No Known Allergies)    History reviewed. No pertinent family history.  Social History   Socioeconomic History  . Marital status: Single    Spouse name: Not on file  . Number of children: Not on file  . Years of education: Not on file  . Highest  education level: Not on file  Occupational History  . Not on file  Social Needs  . Financial resource strain: Not on file  . Food insecurity:    Worry: Not on file    Inability: Not on file  . Transportation needs:    Medical: Not on file    Non-medical: Not on file  Tobacco Use  . Smoking status: Current Some Day Smoker    Packs/day: 1.00    Years: 22.00    Pack years: 22.00    Types: Cigarettes  . Smokeless tobacco: Never Used  Substance and Sexual Activity  . Alcohol use: No    Frequency: Never  . Drug use: No  . Sexual activity: Yes    Birth control/protection: None  Lifestyle  . Physical activity:    Days per week: Not on file    Minutes per session: Not on file  . Stress: Not on file  Relationships  . Social connections:    Talks on phone: Not on file    Gets together: Not on file    Attends religious service: Not on file    Active member of club or organization: Not on file    Attends meetings of clubs or organizations: Not on file    Relationship status: Not on file  . Intimate partner violence:    Fear of current or ex partner: Not on file    Emotionally abused:  Not on file    Physically abused: Not on file    Forced sexual activity: Not on file  Other Topics Concern  . Not on file  Social History Narrative  . Not on file    Review of Systems: See HPI, otherwise negative ROS  Physical Exam: BP (!) 141/91   Pulse 92   Temp (!) 96.3 F (35.7 C) (Tympanic)   Resp 17   Ht 4\' 11"  (1.499 m)   Wt 117.9 kg   SpO2 100%   BMI 52.51 kg/m  General:   Alert,  pleasant and cooperative in NAD Head:  Normocephalic and atraumatic. Neck:  Supple; no masses or thyromegaly. Lungs:  Clear throughout to auscultation, normal respiratory effort.    Heart:  +S1, +S2, Regular rate and rhythm, No edema. Abdomen:  Soft, nontender and nondistended. Normal bowel sounds, without guarding, and without rebound.   Neurologic:  Alert and  oriented x4;  grossly normal  neurologically.  Impression/Plan: [name] is here for an colonoscopy to be performed for  Family history of colon cancer. Risks, benefits, limitations, and alternatives regarding  colonoscopy have been reviewed with the patient.  Questions have been answered.  All parties agreeable.   [name], MD  11/17/2017, 11:23 AM

## 2017-11-17 NOTE — Anesthesia Postprocedure Evaluation (Signed)
Anesthesia Post Note  Patient: Carmen [name]  Procedure(s) Performed: COLONOSCOPY WITH PROPOFOL (N/A )  Patient location during evaluation: Endoscopy Anesthesia Type: General Level of consciousness: awake and alert Pain management: pain level controlled Vital Signs Assessment: post-procedure vital signs reviewed and stable Respiratory status: spontaneous breathing, nonlabored ventilation, respiratory function stable and patient connected to nasal cannula oxygen Cardiovascular status: blood pressure returned to baseline and stable Postop Assessment: no apparent nausea or vomiting Anesthetic complications: no     Last Vitals:  Vitals:   11/17/17 1219 11/17/17 1229  BP: 95/66 108/78  Pulse: 66   Resp: 12   Temp:    SpO2: 100%     Last Pain:  Vitals:   11/17/17 1229  TempSrc:   PainSc: 0-No pain                 Precious Haws Macaiah Mangal

## 2017-11-17 NOTE — Transfer of Care (Signed)
Immediate Anesthesia Transfer of Care Note  Patient: Carmen [name]  Procedure(s) Performed: COLONOSCOPY WITH PROPOFOL (N/A )  Patient Location: Endoscopy Unit  Anesthesia Type:General  Level of Consciousness: awake  Airway & Oxygen Therapy: Patient Spontanous Breathing and Patient connected to nasal cannula oxygen  Post-op Assessment: Report given to RN and Post -op Vital signs reviewed and stable  Post vital signs: Reviewed  Last Vitals:  Vitals Value Taken Time  BP 120/88 11/17/2017 11:59 AM  Temp 36.2 C 11/17/2017 11:59 AM  Pulse 91 11/17/2017 11:59 AM  Resp 14 11/17/2017 11:59 AM  SpO2 100 % 11/17/2017 11:59 AM    Last Pain:  Vitals:   11/17/17 1057  TempSrc: Tympanic  PainSc: 0-No pain         Complications: No apparent anesthesia complications

## 2017-11-17 NOTE — Op Note (Signed)
[facility] Gastroenterology Patient Name: Carmen [name] Procedure Date: 11/17/2017 11:24 AM MRN: [id number] Account #: [id number] Date of Birth: 1975/10/18 Admit Type: Outpatient Age: 42 Room: [facility] ENDO ROOM 1 Gender: Female Note Status: Finalized Procedure:            Colonoscopy Indications:          Screening in patient at increased risk: Family history                        of 1st-degree relative with colorectal cancer Providers:            [name] MD, MD Referring MD:         [facility] ***Barton Dubois (Referring MD), No                        Local Md, MD (Referring MD) Medicines:            Monitored Anesthesia Care Complications:        No immediate complications. Procedure:            Pre-Anesthesia Assessment:                       - Prior to the procedure, a History and Physical was                        performed, and patient medications, allergies and                        sensitivities were reviewed. The patient's tolerance of                        previous anesthesia was reviewed.                       - The risks and benefits of the procedure and the                        sedation options and risks were discussed with the                        patient. All questions were answered and informed                        consent was obtained.                       - ASA Grade Assessment: II - A patient with mild                        systemic disease.                       After obtaining informed consent, the colonoscope was                        passed under direct vision. Throughout the procedure,                        the patient's blood pressure, pulse, and oxygen  saturations were monitored continuously. The                        Colonoscope was introduced through the anus and                        advanced to the the cecum, identified by the                        appendiceal orifice, IC valve and  transillumination.                        The colonoscopy was performed with ease. The patient                        tolerated the procedure well. The quality of the bowel                        preparation was good. Findings:      The perianal and digital rectal examinations were normal.      A 3 mm polyp was found in the ascending colon. The polyp was sessile.       The polyp was removed with a cold biopsy forceps. Resection and       retrieval were complete.      A 6 mm polyp was found in the descending colon. The polyp was sessile.       The polyp was removed with a cold snare. Resection and retrieval were       complete.      The entire examined colon appeared normal on direct and retroflexion       views.      A few small-mouthed diverticula were found in the proximal ascending       colon. Impression:           - One 3 mm polyp in the ascending colon, removed with a                        cold biopsy forceps. Resected and retrieved.                       - One 6 mm polyp in the descending colon, removed with                        a cold snare. Resected and retrieved.                       - The entire examined colon is normal on direct and                        retroflexion views. Recommendation:       - Discharge patient to home (with escort).                       - Resume previous diet.                       - Continue present medications.                       - Await pathology results.                       -  Repeat colonoscopy in 5 years for surveillance. Procedure Code(s):    --- Professional ---                       [phone], Colonoscopy, flexible; with removal of tumor(s),                        polyp(s), or other lesion(s) by snare technique                       [id number], 95, Colonoscopy, flexible; with biopsy, single                        or multiple Diagnosis Code(s):    --- Professional ---                       Z80.0, Family history of malignant neoplasm of                         digestive organs                       D12.2, Benign neoplasm of ascending colon                       D12.4, Benign neoplasm of descending colon CPT copyright 2018 American Medical Association. All rights reserved. The codes documented in this report are preliminary and upon coder review may  be revised to meet current compliance requirements. [name], MD [name] MD, MD 11/17/2017 11:57:44 AM This report has been signed electronically. Number of Addenda: 0 Note Initiated On: 11/17/2017 11:24 AM Scope Withdrawal Time: 0 hours 12 minutes 50 seconds  Total Procedure Duration: 0 hours 15 minutes 5 seconds       [facility]

## 2017-11-17 NOTE — Anesthesia Preprocedure Evaluation (Signed)
Anesthesia Evaluation  Patient identified by MRN, date of birth, ID band Patient awake    Reviewed: Allergy & Precautions, H&P , NPO status , Patient's Chart, lab work & pertinent test results  History of Anesthesia Complications Negative for: history of anesthetic complications  Airway Mallampati: II  TM Distance: >3 FB Neck ROM: full    Dental  (+) Chipped, Poor Dentition   Pulmonary neg pulmonary ROS, neg shortness of breath, Current Smoker,           Cardiovascular hypertension, (-) angina(-) Past MI and (-) DOE      Neuro/Psych PSYCHIATRIC DISORDERS negative neurological ROS  negative psych ROS   GI/Hepatic negative GI ROS, Neg liver ROS,   Endo/Other  diabetes, Type 2Morbid obesity  Renal/GU negative Renal ROS  negative genitourinary   Musculoskeletal   Abdominal   Peds  Hematology negative hematology ROS (+)   Anesthesia Other Findings Past Medical History: No date: Anxiety     Comment:  NO MEDS No date: Depression     Comment:  NO MEDS No date: Diabetes mellitus without complication (HCC) No date: High cholesterol No date: Hypertension No date: Morbid obesity ([place])  Past Surgical History: No date: CHOLECYSTECTOMY No date: COLONOSCOPY WITH PROPOFOL 02/17/2017: ESOPHAGOGASTRODUODENOSCOPY (EGD) WITH PROPOFOL; N/A     Comment:  Procedure: ESOPHAGOGASTRODUODENOSCOPY (EGD) WITH               PROPOFOL;  Surgeon: [name], MD;  Location: [facility]               ENDOSCOPY;  Service: Gastroenterology;  Laterality: N/A; 04/23/2017: ROBOTIC ASSISTED LAPAROSCOPIC CHOLECYSTECTOMY; N/A     Comment:  Procedure: ROBOTIC ASSISTED LAPAROSCOPIC               CHOLECYSTECTOMY;  Surgeon: Jules Husbands, MD;  Location:              [facility] ORS;  Service: General;  Laterality: N/A;  BMI    Body Mass Index:  52.51 kg/m      Reproductive/Obstetrics negative OB ROS                              Anesthesia Physical Anesthesia Plan  ASA: III  Anesthesia Plan: General   Post-op Pain Management:    Induction: Intravenous  PONV Risk Score and Plan: Propofol infusion and TIVA  Airway Management Planned: Natural Airway and Nasal Cannula  Additional Equipment:   Intra-op Plan:   Post-operative Plan:   Informed Consent: I have reviewed the patients History and Physical, chart, labs and discussed the procedure including the risks, benefits and alternatives for the proposed anesthesia with the patient or authorized representative who has indicated his/her understanding and acceptance.   Dental Advisory Given  Plan Discussed with: Anesthesiologist, CRNA and Surgeon  Anesthesia Plan Comments: (Patient consented for risks of anesthesia including but not limited to:  - adverse reactions to medications - risk of intubation if required - damage to teeth, lips or other oral mucosa - sore throat or hoarseness - Damage to heart, brain, lungs or loss of life  Patient voiced understanding.)        Anesthesia Quick Evaluation

## 2017-11-18 ENCOUNTER — Encounter: Payer: Self-pay | Admitting: Gastroenterology

## 2017-11-19 LAB — SURGICAL PATHOLOGY

## 2017-11-22 ENCOUNTER — Encounter: Payer: Self-pay | Admitting: Gastroenterology

## 2018-10-23 IMAGING — NM NM HEPATO W/GB/PHARM/[PERSON_NAME]
2 series · 12 of 12 positions shown · non-contrast
Comparison: None.

CLINICAL DATA: Right upper quadrant pain for several days.  Nausea.

EXAM:
NUCLEAR MEDICINE HEPATOBILIARY IMAGING WITH GALLBLADDER EF
TECHNIQUE: Sequential images of the abdomen were obtained [DATE] minutes
following intravenous administration of radiopharmaceutical. After
oral ingestion of Ensure, gallbladder ejection fraction was
determined. At 60 min, normal ejection fraction is greater than 33%.
RADIOPHARMACEUTICALS:  5.1 mCi Nc-33m  Choletec IV

[Series 1000: hepatobiliary scan · 9.59mm/px · 6 of 60 frames shown]
[frame 6/60]
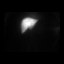
[frame 16/60]
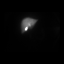
[frame 26/60]
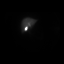
[frame 36/60]
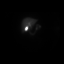
[frame 46/60]
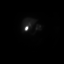
[frame 56/60]
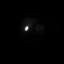

[Series 1000: gallbladder ef · 4.80mm/px · 6 of 120 frames shown]
[frame 11/120]
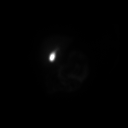
[frame 31/120]
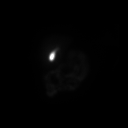
[frame 51/120]
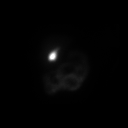
[frame 71/120]
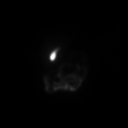
[frame 91/120]
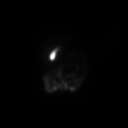
[frame 111/120]
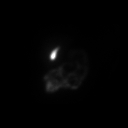

[12 of 12 positions shown; findings below may reference images not displayed]

FINDINGS: Prompt uptake and biliary excretion of activity by the liver is
seen. Gallbladder activity is visualized, consistent with patency of
cystic duct. Biliary activity passes into small bowel, consistent
with patent common bile duct.

Calculated gallbladder ejection fraction is 36%. (Normal gallbladder
ejection fraction with Ensure is greater than 33%.)
IMPRESSION: Normal imaging study demonstrating patency of both cystic and common
bile ducts.

Low normal gallbladder ejection fraction of 36%.

## 2018-12-02 ENCOUNTER — Other Ambulatory Visit: Payer: Self-pay

## 2018-12-02 ENCOUNTER — Ambulatory Visit: Payer: Self-pay | Admitting: Advanced Practice Midwife

## 2018-12-02 ENCOUNTER — Encounter: Payer: Self-pay | Admitting: Advanced Practice Midwife

## 2018-12-02 DIAGNOSIS — Z113 Encounter for screening for infections with a predominantly sexual mode of transmission: Secondary | ICD-10-CM

## 2018-12-02 DIAGNOSIS — A599 Trichomoniasis, unspecified: Secondary | ICD-10-CM

## 2018-12-02 DIAGNOSIS — Z789 Other specified health status: Secondary | ICD-10-CM | POA: Insufficient documentation

## 2018-12-02 DIAGNOSIS — Z7289 Other problems related to lifestyle: Secondary | ICD-10-CM

## 2018-12-02 DIAGNOSIS — F17200 Nicotine dependence, unspecified, uncomplicated: Secondary | ICD-10-CM | POA: Insufficient documentation

## 2018-12-02 LAB — WET PREP FOR TRICH, YEAST, CLUE
Trichomonas Exam: POSITIVE — AB
Yeast Exam: NEGATIVE

## 2018-12-02 MED ORDER — METRONIDAZOLE 500 MG PO TABS: 2000.0000 mg | ORAL_TABLET | Freq: Once | ORAL | 0 refills | Status: AC

## 2018-12-02 NOTE — Progress Notes (Signed)
Wet Prep results reviewed. Per standing orders patient treated for Trich. Hal Morales, RN

## 2018-12-02 NOTE — Progress Notes (Signed)
    STI clinic/screening visit  Subjective:  Carmen [name] is a 43 y.o.nulliparous smoker female being seen today for an STI screening visit. The patient reports they do not have symptoms.  Patient has the following medical conditions:   Patient Active Problem List   Diagnosis Date Noted  . Morbid obesity ([place]) 260 lb 12/02/2018  . Smoker 1-1 1/2 ppd 12/02/2018  . Biliary dyskinesia   . Encounter for smoking cessation counseling 01/05/2017  . Weight loss 01/05/2017  . Healthcare maintenance 01/05/2017  . Anxiety 03/17/2016  . Essential hypertension 08/15/2015  . Type 2 diabetes mellitus ([place]) 08/15/2015     No chief complaint on file.   HPI  Patient reports no symptoms just wants screen  See flowsheet for further details and programmatic requirements.    The following portions of the patient's history were reviewed and updated as appropriate: allergies, current medications, past medical history, past social history, past surgical history and problem list.  Objective:  There were no vitals filed for this visit.  Physical Exam Constitutional:      Appearance: She is obese.  HENT:     Head: Normocephalic and atraumatic.  Eyes:     Conjunctiva/sclera: Conjunctivae normal.  Neck:     Musculoskeletal: Neck supple.  Pulmonary:     Effort: Pulmonary effort is normal.     Breath sounds: Normal breath sounds.  Abdominal:     Palpations: Abdomen is soft.     Tenderness: There is no abdominal tenderness. There is no guarding.     Comments: Poor tone, increased adipose, soft without tenderness  Genitourinary:    General: Normal vulva.     Exam position: Lithotomy position.     Labia:        Right: No lesion.        Left: No lesion.      Vagina: Vaginal discharge (small amt white creamy malodorous leukorrhea, ph>4.5) present.     Cervix: No friability or erythema.     Rectum: Normal.  Lymphadenopathy:     Lower Body: No right inguinal adenopathy. No left inguinal  adenopathy.  Skin:    General: Skin is warm and dry.  Neurological:     Mental Status: She is alert.       Assessment and Plan:  Carmen [name] is a 43 y.o. female presenting to the [facility] Department for STI screening  1. Morbid obesity ([place])   2. Screening examination for venereal disease Treat wet mount per standing orders Immunization nurse consult - WET PREP FOR [place], YEAST, CLUE - Syphilis Serology, [place] [facility] - HIV [place] [facility] - Chlamydia/Gonorrhea  Lab  3. Smoker 1-1 1/2 ppd Counseled via 5 A's to stop smoking     Return if symptoms worsen or fail to improve.  No future appointments.  Herbie Saxon, CNM

## 2018-12-24 ENCOUNTER — Ambulatory Visit: Payer: Self-pay | Admitting: Physician Assistant

## 2018-12-24 ENCOUNTER — Other Ambulatory Visit: Payer: Self-pay

## 2018-12-24 DIAGNOSIS — Z113 Encounter for screening for infections with a predominantly sexual mode of transmission: Secondary | ICD-10-CM

## 2018-12-24 LAB — WET PREP FOR TRICH, YEAST, CLUE
Trichomonas Exam: NEGATIVE
Yeast Exam: NEGATIVE

## 2018-12-24 NOTE — Progress Notes (Signed)
Wet mount reviewed and is negative today. No treatment needed per standing order. Provider orders completed.Ronny Bacon, RN

## 2018-12-25 ENCOUNTER — Encounter: Payer: Self-pay | Admitting: Physician Assistant

## 2018-12-25 NOTE — Progress Notes (Signed)
    STI clinic/screening visit  Subjective:  Carmen [name] is a 43 y.o. female being seen today for an STI screening visit. The patient reports they do not have symptoms.  Patient has the following medical conditions:   Patient Active Problem List   Diagnosis Date Noted  . Morbid obesity ([place]) 260 lb 12/02/2018  . Smoker 1-1 1/2 ppd 12/02/2018  . --4 shots Tequila +1 margarita 11/2018 12/02/2018  . Biliary dyskinesia   . Encounter for smoking cessation counseling 01/05/2017  . Weight loss 01/05/2017  . Healthcare maintenance 01/05/2017  . Anxiety 03/17/2016  . Essential hypertension--Lisinopril 08/15/2015  . Type 2 diabetes mellitus (HCC)--Gardiance 08/15/2015     Chief Complaint  Patient presents with  . SEXUALLY TRANSMITTED DISEASE    HPI  Patient reports that she was treated for Trich a few weeks ago and desires to have a recheck to make sure that it has cleared.  States that she has noticed a slight increase in the amount of discharge but no other symptoms.   See flowsheet for further details and programmatic requirements.    The following portions of the patient's history were reviewed and updated as appropriate: allergies, current medications, past medical history, past social history, past surgical history and problem list.  Objective:  There were no vitals filed for this visit.  Physical Exam Constitutional:      General: She is not in acute distress.    Appearance: Normal appearance.  HENT:     Head: Normocephalic and atraumatic.  Neck:     Musculoskeletal: Neck supple.  Pulmonary:     Effort: Pulmonary effort is normal.  Abdominal:     Palpations: Abdomen is soft. There is no mass.     Tenderness: There is no abdominal tenderness. There is no guarding or rebound.  Genitourinary:    General: Normal vulva.     Rectum: Normal.     Comments: External genitalia/pubic area without nits, lice, edema, erythema, lesions and inguinal adenopathy. Vagina with  normal mucosa and discharge. Cervix without visible lesions. Uterus firm, mobile, nt, no masses, no CMT, no adnexal tenderness or fullness. Lymphadenopathy:     Cervical: No cervical adenopathy.  Skin:    General: Skin is warm and dry.     Findings: No bruising, erythema, lesion or rash.  Neurological:     Mental Status: She is alert and oriented to person, place, and time.  Psychiatric:        Mood and Affect: Mood normal.        Behavior: Behavior normal.        Thought Content: Thought content normal.        Judgment: Judgment normal.       Assessment and Plan:  BETHANNE LUNDSTEDT is a 43 y.o. female presenting to the [facility] Department for STI screening  1. Screening for STD (sexually transmitted disease) Patient into clinic to recheck and make sure Dalbert Batman has cleared.  Declines all screening except wet mount to recheck for Trich today. Rec condoms with all sex. - WET PREP FOR [place], YEAST, CLUE     No follow-ups on file.  No future appointments.  Jerene Dilling, PA

## 2019-07-08 ENCOUNTER — Ambulatory Visit: Payer: Self-pay | Admitting: Physician Assistant

## 2019-07-08 ENCOUNTER — Other Ambulatory Visit: Payer: Self-pay

## 2019-07-08 DIAGNOSIS — Z113 Encounter for screening for infections with a predominantly sexual mode of transmission: Secondary | ICD-10-CM

## 2019-07-08 LAB — WET PREP FOR TRICH, YEAST, CLUE
Trichomonas Exam: NEGATIVE
Yeast Exam: NEGATIVE

## 2019-07-08 NOTE — Progress Notes (Signed)
Wet mount reviewed and no treatment needed per standing order as pt is not having any symptoms. Provider orders completed.Ronny Bacon, RN

## 2019-07-09 ENCOUNTER — Encounter: Payer: Self-pay | Admitting: Physician Assistant

## 2019-07-09 NOTE — Progress Notes (Signed)
[facility] Department STI clinic/screening visit  Subjective:  Carmen [name] is a 44 y.o. female being seen today for an STI screening visit. The patient reports they do not have symptoms.  Patient reports that they do not desire a pregnancy in the next year.   They reported they are not interested in discussing contraception today.  No LMP recorded (exact date).   Patient has the following medical conditions:   Patient Active Problem List   Diagnosis Date Noted  . Morbid obesity (Carmen [name]) 260 lb 12/02/2018  . Smoker 1-1 1/2 ppd 12/02/2018  . --4 shots Tequila +1 margarita 11/2018 12/02/2018  . Biliary dyskinesia   . Encounter for smoking cessation counseling 01/05/2017  . Weight loss 01/05/2017  . Healthcare maintenance 01/05/2017  . Anxiety 03/17/2016  . Essential hypertension--Lisinopril 08/15/2015  . Type 2 diabetes mellitus (HCC)--Gardiance 08/15/2015    Chief Complaint  Patient presents with  . SEXUALLY TRANSMITTED DISEASE    screening    HPI  Patient reports that she is not having any symptoms but would like a screening today.  LMP 06/28/2019 and normal.  Last HIV test was 1 year ago and had last pap in 2020, as well.  Using condoms as BCM.  Has HTN and DM which she takes medications for regularly.  See flowsheet for further details and programmatic requirements.    The following portions of the patient's history were reviewed and updated as appropriate: allergies, current medications, past medical history, past social history, past surgical history and problem list.  Objective:  There were no vitals filed for this visit.  Physical Exam Constitutional:      General: She is not in acute distress.    Appearance: Normal appearance.  HENT:     Head: Normocephalic and atraumatic.     Comments: No nits, lice or hair loss. No cervical, supraclavicular or axillary adenopathy.    Mouth/Throat:     Mouth: Mucous membranes are moist.     Pharynx: Oropharynx is  clear. No oropharyngeal exudate or posterior oropharyngeal erythema.  Eyes:     Conjunctiva/sclera: Conjunctivae normal.  Pulmonary:     Effort: Pulmonary effort is normal.  Abdominal:     Palpations: Abdomen is soft. There is no mass.     Tenderness: There is no abdominal tenderness. There is no guarding or rebound.  Genitourinary:    General: Normal vulva.     Rectum: Normal.     Comments: External genitalia/pubic area without nits, lice, edema, erythema, lesions and inguinal adenopathy. Vagina with normal mucosa and discharge. Cervix without visible lesions. Uterus firm, mobile, nt, no masses,no CMT, no adnexal tenderness or fullness. Musculoskeletal:     Cervical back: Neck supple. No tenderness.  Skin:    General: Skin is warm and dry.     Findings: No bruising, erythema, lesion or rash.  Neurological:     Mental Status: She is alert and oriented to person, place, and time.  Psychiatric:        Mood and Affect: Mood normal.        Behavior: Behavior normal.        Thought Content: Thought content normal.        Judgment: Judgment normal.      Assessment and Plan:  [name] is a 44 y.o. female presenting to the [facility] Department for STI screening  1. Screening for STD (sexually transmitted disease) Patient into clinic without symptoms. Rec condoms with all sex. Await test  results.  Counseled that RN will call if needs to RTC for treatment once results are back. - WET PREP FOR [place], YEAST, [place] [facility] - Syphilis Serology, [place] [facility]     No follow-ups on file.  No future appointments.  Jerene Dilling, PA

## 2020-10-26 ENCOUNTER — Other Ambulatory Visit: Payer: Self-pay

## 2020-10-26 ENCOUNTER — Encounter: Payer: Self-pay | Admitting: Nurse Practitioner

## 2020-10-26 ENCOUNTER — Ambulatory Visit (INDEPENDENT_AMBULATORY_CARE_PROVIDER_SITE_OTHER): Payer: 59 | Admitting: Nurse Practitioner

## 2020-10-26 VITALS — BP 119/83 | HR 102 | Temp 98.2°F | Ht 59.0 in | Wt 236.0 lb

## 2020-10-26 DIAGNOSIS — E1169 Type 2 diabetes mellitus with other specified complication: Secondary | ICD-10-CM | POA: Diagnosis not present

## 2020-10-26 DIAGNOSIS — Z1159 Encounter for screening for other viral diseases: Secondary | ICD-10-CM

## 2020-10-26 DIAGNOSIS — Z7689 Persons encountering health services in other specified circumstances: Secondary | ICD-10-CM | POA: Diagnosis not present

## 2020-10-26 DIAGNOSIS — I10 Essential (primary) hypertension: Secondary | ICD-10-CM

## 2020-10-26 MED ORDER — ATORVASTATIN CALCIUM 20 MG PO TABS: 20.0000 mg | ORAL_TABLET | Freq: Every evening | ORAL | 1 refills | Status: DC

## 2020-10-26 MED ORDER — LISINOPRIL 5 MG PO TABS: 5.0000 mg | ORAL_TABLET | Freq: Every evening | ORAL | 1 refills | Status: DC

## 2020-10-26 NOTE — Progress Notes (Signed)
BP 119/83   Pulse (!) 102   Temp 98.2 F (36.8 C) (Oral)   Ht _0  (1.499 m)   Wt 236 lb (107 kg)   SpO2 98%   BMI 47.67 kg/m    Subjective:    Patient ID: Carmen [name], female    DOB: [birth year], 45 y.o.   MRN: [id number]  HPI: Carmen [name] is a 45 y.o. female  Chief Complaint  Patient presents with   Establish Care   Patient presents to clinic to establish care with new PCP.  Patient reports a history of HTN, Diabetes, depression, and anxiety.  Patient states during covid she lost her job.  She couldn't afford her medication. Patient is a former smoker. Patient states she feels like she has some drainage in the back of her throat. States her congestion has improved since she stopped smoking. Patient states that Metformin gave her a lot of diarrhea and would rather not start that medication again sif she doesn't have to.  Patient denies a history of: Elevated Cholesterol, Thyroid problems, Neurological problems, and Abdominal problems.   Patient denies concerns at visit today.  She has not been on any medications since she lost her job.    Denies HA, CP, SOB, dizziness, palpitations, visual changes, and lower extremity swelling.  Active Ambulatory Problems    Diagnosis Date Noted   Anxiety 03/17/2016   Encounter for smoking cessation counseling 01/05/2017   Essential hypertension--Lisinopril 08/15/2015   Type 2 diabetes mellitus (HCC)--Gardiance 08/15/2015   Weight loss 01/05/2017   Healthcare maintenance 01/05/2017   Biliary dyskinesia    Morbid obesity ([employer] [facility] [place]) 260 lb 12/02/2018   Smoker 1-1 1/2 ppd 12/02/2018   --4 shots Tequila +1 margarita 11/2018 12/02/2018   Resolved Ambulatory Problems    Diagnosis Date Noted   No Resolved Ambulatory Problems   Past Medical History:  Diagnosis Date   Depression    Diabetes mellitus without complication ([place])    High cholesterol    Hypertension    Past Surgical History:  Procedure Laterality Date   CHOLECYSTECTOMY      COLONOSCOPY WITH PROPOFOL     COLONOSCOPY WITH PROPOFOL N/A 11/17/2017   Procedure: COLONOSCOPY WITH PROPOFOL;  Surgeon: [name], MD;  Location: [facility] ENDOSCOPY;  Service: Gastroenterology;  Laterality: N/A;   ESOPHAGOGASTRODUODENOSCOPY (EGD) WITH PROPOFOL N/A 02/17/2017   Procedure: ESOPHAGOGASTRODUODENOSCOPY (EGD) WITH PROPOFOL;  Surgeon: [name], MD;  Location[facility] ENDOSCOPY;  Service: Gastroenterology;  Laterality: N/A;   ROBOTIC ASSISTED LAPAROSCOPIC CHOLECYSTECTOMY N/A 04/23/2017   Procedure: ROBOTIC ASSISTED LAPAROSCOPIC CHOLECYSTECTOMY;  Surgeon: Jules Husbands, MD;  Location: [facility] ORS;  Service: General;  Laterality: N/A;   History reviewed. No pertinent family history.   Review of Systems  Eyes:  Negative for visual disturbance.  Respiratory:  Negative for cough, chest tightness and shortness of breath.   Cardiovascular:  Negative for chest pain, palpitations and leg swelling.  Neurological:  Negative for dizziness and headaches.   Per HPI unless specifically indicated above     Objective:    BP 119/83   Pulse (!) 102   Temp 98.2 F (36.8 C) (Oral)   Ht _1  (1.499 m)   Wt 236 lb (107 kg)   SpO2 98%   BMI 47.67 kg/m   Wt Readings from Last 3 Encounters:  10/26/20 236 lb (107 kg)  11/17/17 260 lb (117.9 kg)  09/24/17 255 lb 9.6 oz (115.9 kg)    Physical Exam Vitals and nursing note reviewed.  Constitutional:      General: She is not in acute distress.    Appearance: Normal appearance. She is obese. She is not ill-appearing, toxic-appearing or diaphoretic.  HENT:     Head: Normocephalic.     Right Ear: External ear normal.     Left Ear: External ear normal.     Nose: Nose normal.     Mouth/Throat:     Mouth: Mucous membranes are moist.     Pharynx: Oropharynx is clear.  Eyes:     General:        Right eye: No discharge.        Left eye: No discharge.     Extraocular Movements: Extraocular movements intact.     Conjunctiva/sclera: Conjunctivae  normal.     Pupils: Pupils are equal, round, and reactive to light.  Cardiovascular:     Rate and Rhythm: Normal rate and regular rhythm.     Heart sounds: No murmur heard. Pulmonary:     Effort: Pulmonary effort is normal. No respiratory distress.     Breath sounds: Normal breath sounds. No wheezing or rales.  Musculoskeletal:     Cervical back: Normal range of motion and neck supple.  Skin:    General: Skin is warm and dry.     Capillary Refill: Capillary refill takes less than 2 seconds.  Neurological:     General: No focal deficit present.     Mental Status: She is alert and oriented to person, place, and time. Mental status is at baseline.  Psychiatric:        Mood and Affect: Mood normal.        Behavior: Behavior normal.        Thought Content: Thought content normal.        Judgment: Judgment normal.    Results for orders placed or performed in visit on 07/08/19  WET PREP FOR [place], YEAST, CLUE  Result Value Ref Range   Trichomonas Exam Negative Negative   Yeast Exam Negative Negative   Clue Cell Exam Comment: Negative      Assessment & Plan:   Problem List Items Addressed This Visit       Cardiovascular and Mediastinum   Essential hypertension--Lisinopril    Chronic.  Patient's blood pressure is well controlled.  Will restart Lisinopril 86m daily for kidney protection.  Patient agrees with the plan of care. Follow up in 3 months for reevaluation.      Relevant Medications   lisinopril (ZESTRIL) 5 MG tablet   atorvastatin (LIPITOR) 20 MG tablet   Other Relevant Orders   Comp Met (CMET)   HgB A1c     Endocrine   Type 2 diabetes mellitus (HCC)--Gardiance - Primary    Chronic. Unsure of last A1c.  Would rather not restart Metformin.  Discussed possibly starting Januvia and Farxiga to help with diabetes control if needed based off A1c. Labs ordered today.  Will make further recommendations based on lab results. Agrees to restart Lipitor for protection.   Medication sent to the pharmacy today.       Relevant Medications   lisinopril (ZESTRIL) 5 MG tablet   atorvastatin (LIPITOR) 20 MG tablet   Other Relevant Orders   Comp Met (CMET)   Lipid Profile   Other Visit Diagnoses     Encounter for hepatitis C screening test for low risk patient       Relevant Orders   Hepatitis C Antibody   Encounter to establish care  Follow up plan: Return in about 3 months (around 01/25/2021) for Physical and Fasting labs.

## 2020-10-26 NOTE — Assessment & Plan Note (Signed)
Chronic. Unsure of last A1c.  Would rather not restart Metformin.  Discussed possibly starting Januvia and Farxiga to help with diabetes control if needed based off A1c. Labs ordered today.  Will make further recommendations based on lab results. Agrees to restart Lipitor for protection.  Medication sent to the pharmacy today.

## 2020-10-26 NOTE — Assessment & Plan Note (Signed)
Chronic.  Patient's blood pressure is well controlled.  Will restart Lisinopril '5mg'$  daily for kidney protection.  Patient agrees with the plan of care. Follow up in 3 months for reevaluation.

## 2020-10-27 LAB — LIPID PANEL
Chol/HDL Ratio: 2.1 ratio (ref 0.0–4.4)
Cholesterol, Total: 154 mg/dL (ref 100–199)
HDL: 73 mg/dL (ref >39)
LDL Chol Calc (NIH): 60 mg/dL (ref 0–99)
Triglycerides: 118 mg/dL (ref 0–149)
VLDL Cholesterol Cal: 21 mg/dL (ref 5–40)

## 2020-10-27 LAB — COMPREHENSIVE METABOLIC PANEL
ALT: 26 IU/L (ref 0–32)
AST: 14 IU/L (ref 0–40)
Albumin/Globulin Ratio: 1.5 (ref 1.2–2.2)
Albumin: 4.1 g/dL (ref 3.8–4.8)
Alkaline Phosphatase: 115 IU/L (ref 44–121)
BUN/Creatinine Ratio: 11 (ref 9–23)
BUN: 10 mg/dL (ref 6–24)
Bilirubin Total: 0.3 mg/dL (ref 0.0–1.2)
CO2: 24 mmol/L (ref 20–29)
Calcium: 9.6 mg/dL (ref 8.7–10.2)
Chloride: 97 mmol/L (ref 96–106)
Creatinine, Ser: 0.92 mg/dL (ref 0.57–1.00)
Globulin, Total: 2.7 g/dL (ref 1.5–4.5)
Glucose: 382 mg/dL — ABNORMAL HIGH (ref 65–99)
Potassium: 4.4 mmol/L (ref 3.5–5.2)
Sodium: 135 mmol/L (ref 134–144)
Total Protein: 6.8 g/dL (ref 6.0–8.5)
eGFR: 78 mL/min/{1.73_m2} (ref >59)

## 2020-10-27 LAB — HEMOGLOBIN A1C
Est. average glucose Bld gHb Est-mCnc: 252 mg/dL
Hgb A1c MFr Bld: 10.4 % — ABNORMAL HIGH (ref 4.8–5.6)

## 2020-10-27 LAB — HEPATITIS C ANTIBODY: Hep C Virus Ab: <0.1 s/co ratio (ref 0.0–0.9)

## 2020-10-29 NOTE — Progress Notes (Signed)
Please let patient know that her A1c came back at 10.4%.  We need to work really hard to get this under control.  I recommend she start an injectable called Ozempic.  She can come in and I can give her a sample and show her how to use it in office.  If she is hesitant to do the injection I can send in the [place] to the pharmacy.  However, I do think she will need an injectable at some point.  Other lab work looks good.  Please let me know what patient decides.

## 2020-10-31 ENCOUNTER — Other Ambulatory Visit: Payer: Self-pay

## 2020-10-31 ENCOUNTER — Ambulatory Visit (INDEPENDENT_AMBULATORY_CARE_PROVIDER_SITE_OTHER): Payer: 59 | Admitting: Nurse Practitioner

## 2020-10-31 ENCOUNTER — Encounter: Payer: Self-pay | Admitting: Nurse Practitioner

## 2020-10-31 VITALS — BP 124/85 | HR 80 | Temp 98.1°F | Wt 235.8 lb

## 2020-10-31 DIAGNOSIS — L659 Nonscarring hair loss, unspecified: Secondary | ICD-10-CM

## 2020-10-31 DIAGNOSIS — M545 Low back pain, unspecified: Secondary | ICD-10-CM

## 2020-10-31 DIAGNOSIS — E1169 Type 2 diabetes mellitus with other specified complication: Secondary | ICD-10-CM

## 2020-10-31 LAB — URINALYSIS, ROUTINE W REFLEX MICROSCOPIC
Bilirubin, UA: NEGATIVE
Leukocytes,UA: NEGATIVE
Nitrite, UA: NEGATIVE
Protein,UA: NEGATIVE
RBC, UA: NEGATIVE
Specific Gravity, UA: 1.015 (ref 1.005–1.030)
Urobilinogen, Ur: 0.2 mg/dL (ref 0.2–1.0)
pH, UA: 5 (ref 5.0–7.5)

## 2020-10-31 MED ORDER — SEMAGLUTIDE (1 MG/DOSE) 4 MG/3ML ~~LOC~~ SOPN: 1.0000 mg | PEN_INJECTOR | SUBCUTANEOUS | 1 refills | Status: DC

## 2020-10-31 NOTE — Progress Notes (Signed)
Results discussed with patient during visit today.

## 2020-10-31 NOTE — Progress Notes (Signed)
BP 124/85   Pulse 80   Temp 98.1 F (36.7 C) (Oral)   Wt 235 lb 12.8 oz (107 kg)   LMP 09/24/2020 (Approximate)   SpO2 99%   BMI 47.63 kg/m    Subjective:    Patient ID: Carmen [name], female    DOB: 28-Jan-1976, 45 y.o.   MRN: [id number]  HPI: Carmen [name] is a 45 y.o. female  Chief Complaint  Patient presents with   Urinary Tract Infection    Pt states she has had low back pain, a little pressure, and urinary frequency. States she has been having trouble going back to sleep after having to get up during the night.    Alopecia    Pt states she feels like her hair has been thinning at the top of her scalp    URINARY SYMPTOMS Dysuria: no Urinary frequency: yes Urgency: yes Small volume voids: no Symptom severity: no Urinary incontinence: no Foul odor: yes Hematuria: no Abdominal pain: no Back pain: yes Suprapubic pain/pressure: no Flank pain: yes Fever:  no Vomiting: no Relief with cranberry juice: no Relief with pyridium: no Status: better Previous urinary tract infection: no Recurrent urinary tract infection: no Sexual activity: No sexually active History of sexually transmitted disease: yes Treatments attempted: none  HAIR LOSS Patient states she had locks and then shaved her head two years ago.  Then after that she states it hasn't grown back like it is supposed to.  Patient feels like it has gotten better since she stopped getting braids.   Relevant past medical, surgical, family and social history reviewed and updated as indicated. Interim medical history since our last visit reviewed. Allergies and medications reviewed and updated.  Review of Systems  Constitutional:  Negative for fever.  Gastrointestinal:  Negative for abdominal pain and vomiting.  Endocrine:       Hair thinning  Genitourinary:  Positive for flank pain, frequency and urgency. Negative for decreased urine volume, dysuria and hematuria.  Musculoskeletal:  Positive  for back pain.   Per HPI unless specifically indicated above     Objective:    BP 124/85   Pulse 80   Temp 98.1 F (36.7 C) (Oral)   Wt 235 lb 12.8 oz (107 kg)   LMP 09/24/2020 (Approximate)   SpO2 99%   BMI 47.63 kg/m   Wt Readings from Last 3 Encounters:  10/31/20 235 lb 12.8 oz (107 kg)  10/26/20 236 lb (107 kg)  11/17/17 260 lb (117.9 kg)    Physical Exam Vitals and nursing note reviewed.  Constitutional:      General: She is not in acute distress.    Appearance: Normal appearance. She is normal weight. She is not ill-appearing, toxic-appearing or diaphoretic.  HENT:     Head: Normocephalic.     Right Ear: External ear normal.     Left Ear: External ear normal.     Nose: Nose normal.     Mouth/Throat:     Mouth: Mucous membranes are moist.     Pharynx: Oropharynx is clear.  Eyes:     General:        Right eye: No discharge.        Left eye: No discharge.     Extraocular Movements: Extraocular movements intact.     Conjunctiva/sclera: Conjunctivae normal.     Pupils: Pupils are equal, round, and reactive to light.  Cardiovascular:     Rate and Rhythm: Normal rate and regular rhythm.  Heart sounds: No murmur heard. Pulmonary:     Effort: Pulmonary effort is normal. No respiratory distress.     Breath sounds: Normal breath sounds. No wheezing or rales.  Abdominal:     General: Abdomen is flat. Bowel sounds are normal. There is no distension.     Palpations: Abdomen is soft.     Tenderness: There is no abdominal tenderness. There is no right CVA tenderness, left CVA tenderness or guarding.  Musculoskeletal:     Cervical back: Normal range of motion and neck supple.  Skin:    General: Skin is warm and dry.     Capillary Refill: Capillary refill takes less than 2 seconds.       Neurological:     General: No focal deficit present.     Mental Status: She is alert and oriented to person, place, and time. Mental status is at baseline.  Psychiatric:         Mood and Affect: Mood normal.        Behavior: Behavior normal.        Thought Content: Thought content normal.        Judgment: Judgment normal.    Results for orders placed or performed in visit on 10/26/20  Comp Met (CMET)  Result Value Ref Range   Glucose 382 (H) 65 - 99 mg/dL   BUN 10 6 - 24 mg/dL   Creatinine, Ser 0.92 0.57 - 1.00 mg/dL   eGFR 78 >59 mL/min/1.73   BUN/Creatinine Ratio 11 9 - 23   Sodium 135 134 - 144 mmol/L   Potassium 4.4 3.5 - 5.2 mmol/L   Chloride 97 96 - 106 mmol/L   CO2 24 20 - 29 mmol/L   Calcium 9.6 8.7 - 10.2 mg/dL   Total Protein 6.8 6.0 - 8.5 g/dL   Albumin 4.1 3.8 - 4.8 g/dL   Globulin, Total 2.7 1.5 - 4.5 g/dL   Albumin/Globulin Ratio 1.5 1.2 - 2.2   Bilirubin Total 0.3 0.0 - 1.2 mg/dL   Alkaline Phosphatase 115 44 - 121 IU/L   AST 14 0 - 40 IU/L   ALT 26 0 - 32 IU/L  Lipid Profile  Result Value Ref Range   Cholesterol, Total 154 100 - 199 mg/dL   Triglycerides 118 0 - 149 mg/dL   HDL 73 >39 mg/dL   VLDL Cholesterol Cal 21 5 - 40 mg/dL   LDL Chol Calc (NIH) 60 0 - 99 mg/dL   Chol/HDL Ratio 2.1 0.0 - 4.4 ratio  HgB A1c  Result Value Ref Range   Hgb A1c MFr Bld 10.4 (H) 4.8 - 5.6 %   Est. average glucose Bld gHb Est-mCnc 252 mg/dL  Hepatitis C Antibody  Result Value Ref Range   Hep C Virus Ab <0.1 0.0 - 0.9 s/co ratio      Assessment & Plan:   Problem List Items Addressed This Visit       Endocrine   Type 2 diabetes mellitus (HCC)--Gardiance    Chronic. Uncontrolled. A1c is 10.4. Will start Ozempic weekly. 0.86m given with patient in office today. Give 0.233mfor one more week. Then increase to 0.57m39mor 2 weeks then increase to 1mg81mekly.  Follow up in 3 months for reevaluation.      Relevant Medications   Semaglutide, 1 MG/DOSE, 4 MG/3ML SOPN   Other Visit Diagnoses     Acute bilateral low back pain, unspecified whether sciatica present    -  Primary   UA  obtained in office. SHowed 3+ glucose and ketones. Discussed with  patient the importance of getting diabetes under control to help with symptoms.   Relevant Orders   Urinalysis, Routine w reflex microscopic   Thinning hair       Discussed that hair loss can be related to diabetes. Will also check thyroid labs during visit. Will follow up in 3 months to see how hair loss is going.   Relevant Orders   Thyroid Panel With TSH        Follow up plan: Return if symptoms worsen or fail to improve.

## 2020-10-31 NOTE — Assessment & Plan Note (Signed)
Chronic. Uncontrolled. A1c is 10.4. Will start Ozempic weekly. 0.25mg  given with patient in office today. Give 0.25mg  for one more week. Then increase to 0.5mg  for 2 weeks then increase to 1mg  weekly.  Follow up in 3 months for reevaluation.

## 2020-11-01 LAB — THYROID PANEL WITH TSH
Free Thyroxine Index: 2.3 (ref 1.2–4.9)
T3 Uptake Ratio: 25 % (ref 24–39)
T4, Total: 9.3 ug/dL (ref 4.5–12.0)
TSH: 1.75 u[IU]/mL (ref 0.450–4.500)

## 2020-11-01 NOTE — Progress Notes (Signed)
Hi Ms. Carmen [name].  It was good to see you yesterday.  Your Thyroid labs are normal.  Let's continue with our diabetes management plan to see if we can get that under control and your hair loss will improve.  See you at our next visit.

## 2020-11-28 NOTE — Progress Notes (Signed)
BP 120/76   Pulse 92   Temp 98.9 F (37.2 C) (Oral)   Wt 236 lb 6.4 oz (107.2 kg)   LMP 10/26/2020 (Exact Date)   SpO2 98%   BMI 47.75 kg/m    Subjective:    Patient ID: Carmen [name], female    DOB: 04-Jan-1976, 45 y.o.   MRN: [id number]  HPI: Carmen [name] is a 45 y.o. female  Chief Complaint  Patient presents with   Medication Management    Patient is here for a follow up on medication Ozempic. Patient states she thinks it is going better. Patient states she has not been taking her Atorvastatin and Lisinopril due to medication have caused her to feeling weird. Patient states she will restart her blood pressure medication as the cholesterol medication make her feel light-headed and dizzy and states she just feels off. Patient states that is why she stopped taking the medication in the past.    Weight Loss Surgery    Patient states she would like to discuss having weight loss surgery.    Menstrual Problem    Patient states she started her cycle on this past Sunday. Patient states her cycles lately have been irregular. Patient states this time around when it started she noticed some severe cramping and pain that she couldn't move. Patient states she could hardly walk, sit down and stand due to pain.    DIABETES Hypoglycemic episodes:no Polydipsia/polyuria: no Visual disturbance: no Chest pain: no Paresthesias: no Glucose Monitoring: no  Accucheck frequency:  not checking  Fasting glucose:  Post prandial:  Evening:  Before meals: Taking Insulin?: no  Long acting insulin:  Short acting insulin: Blood Pressure Monitoring: not checking Retinal Examination: Not up to Date Foot Exam: Up to Date Diabetic Education: Not Completed Pneumovax: Up to Date Influenza: Up to Date Aspirin: no  Relevant past medical, surgical, family and social history reviewed and updated as indicated. Interim medical history since our last visit reviewed. Allergies and medications  reviewed and updated.  Review of Systems  Eyes:  Negative for visual disturbance.  Respiratory:  Negative for cough, chest tightness and shortness of breath.   Cardiovascular:  Negative for chest pain, palpitations and leg swelling.  Endocrine: Negative for polydipsia and polyuria.  Neurological:  Negative for dizziness, numbness and headaches.   Per HPI unless specifically indicated above     Objective:    BP 120/76   Pulse 92   Temp 98.9 F (37.2 C) (Oral)   Wt 236 lb 6.4 oz (107.2 kg)   LMP 10/26/2020 (Exact Date)   SpO2 98%   BMI 47.75 kg/m   Wt Readings from Last 3 Encounters:  11/29/20 236 lb 6.4 oz (107.2 kg)  10/31/20 235 lb 12.8 oz (107 kg)  10/26/20 236 lb (107 kg)    Physical Exam Vitals and nursing note reviewed.  Constitutional:      General: She is not in acute distress.    Appearance: Normal appearance. She is obese. She is not ill-appearing, toxic-appearing or diaphoretic.  HENT:     Head: Normocephalic.     Right Ear: External ear normal.     Left Ear: External ear normal.     Nose: Nose normal.     Mouth/Throat:     Mouth: Mucous membranes are moist.     Pharynx: Oropharynx is clear.  Eyes:     General:        Right eye: No discharge.  Left eye: No discharge.     Extraocular Movements: Extraocular movements intact.     Conjunctiva/sclera: Conjunctivae normal.     Pupils: Pupils are equal, round, and reactive to light.  Cardiovascular:     Rate and Rhythm: Normal rate and regular rhythm.     Heart sounds: No murmur heard. Pulmonary:     Effort: Pulmonary effort is normal. No respiratory distress.     Breath sounds: Normal breath sounds. No wheezing or rales.  Musculoskeletal:     Cervical back: Normal range of motion and neck supple.  Skin:    General: Skin is warm and dry.     Capillary Refill: Capillary refill takes less than 2 seconds.  Neurological:     General: No focal deficit present.     Mental Status: She is alert and  oriented to person, place, and time. Mental status is at baseline.  Psychiatric:        Mood and Affect: Mood normal.        Behavior: Behavior normal.        Thought Content: Thought content normal.        Judgment: Judgment normal.    Results for orders placed or performed in visit on 10/31/20  Urinalysis, Routine w reflex microscopic  Result Value Ref Range   Specific Gravity, UA 1.015 1.005 - 1.030   pH, UA 5.0 5.0 - 7.5   Color, UA Yellow Yellow   Appearance Ur Cloudy (A) Clear   Leukocytes,UA Negative Negative   Protein,UA Negative Negative/Trace   Glucose, UA 3+ (A) Negative   Ketones, UA Trace (A) Negative   RBC, UA Negative Negative   Bilirubin, UA Negative Negative   Urobilinogen, Ur 0.2 0.2 - 1.0 mg/dL   Nitrite, UA Negative Negative  Thyroid Panel With TSH  Result Value Ref Range   TSH 1.750 0.450 - 4.500 uIU/mL   T4, Total 9.3 4.5 - 12.0 ug/dL   T3 Uptake Ratio 25 24 - 39 %   Free Thyroxine Index 2.3 1.2 - 4.9      Assessment & Plan:   Problem List Items Addressed This Visit       Cardiovascular and Mediastinum   Essential hypertension--Lisinopril    Patient had stopped her Lisinopril due to not feeling right when taking it.  She agrees to restart it thinking that the feeling was from her Atorvastatin.  Discussed with patient to restart Lisinopril.  Once she is tolerating the Lisinopril will consider adding the atorvastatin 2 times per week and then increasing it until she is able to tolerate it without side effects.        Endocrine   Type 2 diabetes mellitus (HCC)--[facility]    Chronic.  Last A1c was 10.4.  Patient is not checking sugars.  Will increase Ozempic to 0.5mg  weekly x 1 month.  If patient is tolerating medication, increase to 1mg  weekly.  Follow up in 2 months for repeat labs.  Discussed with patient that A1c needs to be better controlled before seeing weight loss.  Will hopefully refer patient at next visit.  Patient understands and  agrees with the plan of care.        Follow up plan: Return in about 2 months (around 01/29/2021) for HTN, HLD, DM2 FU.   A total of 30 minutes were spent on this encounter today.  When total time is documented, this includes both the face-to-face and non-face-to-face time personally spent before, during and after the visit on  the date of the encounter discussing plan of care for atorvastatin and increasing medication Ozempic to 0.5mg  weekly.

## 2020-11-29 ENCOUNTER — Ambulatory Visit (INDEPENDENT_AMBULATORY_CARE_PROVIDER_SITE_OTHER): Payer: 59 | Admitting: Nurse Practitioner

## 2020-11-29 ENCOUNTER — Encounter: Payer: Self-pay | Admitting: Nurse Practitioner

## 2020-11-29 ENCOUNTER — Other Ambulatory Visit: Payer: Self-pay

## 2020-11-29 VITALS — BP 120/76 | HR 92 | Temp 98.9°F | Wt 236.4 lb

## 2020-11-29 DIAGNOSIS — E1169 Type 2 diabetes mellitus with other specified complication: Secondary | ICD-10-CM

## 2020-11-29 DIAGNOSIS — I10 Essential (primary) hypertension: Secondary | ICD-10-CM | POA: Diagnosis not present

## 2020-11-29 NOTE — Assessment & Plan Note (Signed)
Patient had stopped her Lisinopril due to not feeling right when taking it.  She agrees to restart it thinking that the feeling was from her Atorvastatin.  Discussed with patient to restart Lisinopril.  Once she is tolerating the Lisinopril will consider adding the atorvastatin 2 times per week and then increasing it until she is able to tolerate it without side effects.

## 2020-11-29 NOTE — Assessment & Plan Note (Addendum)
Chronic.  Last A1c was 10.4.  Patient is not checking sugars.  Will increase Ozempic to 0.5mg  weekly x 1 month.  If patient is tolerating medication, increase to 1mg  weekly.  Follow up in 2 months for repeat labs.  Discussed with patient that A1c needs to be better controlled before seeing weight loss.  Will hopefully refer patient at next visit.  Patient understands and agrees with the plan of care.

## 2020-12-05 ENCOUNTER — Ambulatory Visit: Payer: 59 | Admitting: Nurse Practitioner

## 2021-01-08 ENCOUNTER — Encounter: Payer: Self-pay | Admitting: Nurse Practitioner

## 2021-01-11 ENCOUNTER — Encounter: Payer: Self-pay | Admitting: Nurse Practitioner

## 2021-01-23 NOTE — Progress Notes (Signed)
BP 110/79    Pulse 86    Temp 98.7 F (37.1 C) (Oral)    Ht 5' (1.524 m)    Wt 240 lb 3.2 oz (109 kg)    SpO2 96%    BMI 46.91 kg/m    Subjective:    Patient ID: Carmen [name], female    DOB: [birth year], 45 y.o.   MRN: [id number]  HPI: Carmen [name] is a 45 y.o. female presenting on 01/24/2021 for comprehensive medical examination. Current medical complaints include:none  She currently lives with: Menopausal Symptoms: no  HYPERTENSION Hypertension status: controlled  Satisfied with current treatment? no Duration of hypertension: years BP monitoring frequency:  not checking BP range:  BP medication side effects:  no Medication compliance: good compliance Previous BP meds:lisinopril Aspirin: no Recurrent headaches: no Visual changes: no Palpitations: no Dyspnea: no Chest pain: no Lower extremity edema: no Dizzy/lightheaded: no  DIABETES Hypoglycemic episodes:no Polydipsia/polyuria: no Visual disturbance: no Chest pain: no Paresthesias: no Glucose Monitoring: no  Accucheck frequency: Not Checking  Fasting glucose:  Post prandial:  Evening:  Before meals: Taking Insulin?: no  Long acting insulin:  Short acting insulin: Blood Pressure Monitoring: not checking Retinal Examination: Not up to Date Foot Exam: Up to Date Diabetic Education: Not Completed Pneumovax: Up to Date Influenza: Up to Date Aspirin: no   Depression Screen done today and results listed below:  Depression screen [facility] 2/9 01/24/2021 10/26/2020  Decreased Interest 1 1  Down, Depressed, Hopeless 1 1  PHQ - 2 Score 2 2  Altered sleeping 0 2  Tired, decreased energy 0 1  Change in appetite 2 3  Feeling bad or failure about yourself  0 0  Trouble concentrating 0 0  Moving slowly or fidgety/restless 0 0  Suicidal thoughts 0 0  PHQ-9 Score 4 8  Difficult doing work/chores Somewhat difficult Somewhat difficult    The patient does not have a history of falls. I did complete a  risk assessment for falls. A plan of care for falls was documented.   Past Medical History:  Past Medical History:  Diagnosis Date   Anxiety    NO MEDS   Depression    NO MEDS   Diabetes mellitus without complication ([place])    High cholesterol    Hypertension    Morbid obesity ([place])     Surgical History:  Past Surgical History:  Procedure Laterality Date   CHOLECYSTECTOMY     COLONOSCOPY WITH PROPOFOL     COLONOSCOPY WITH PROPOFOL N/A 11/17/2017   Procedure: COLONOSCOPY WITH PROPOFOL;  Surgeon: [name], MD;  Location: [facility] ENDOSCOPY;  Service: Gastroenterology;  Laterality: N/A;   ESOPHAGOGASTRODUODENOSCOPY (EGD) WITH PROPOFOL N/A 02/17/2017   Procedure: ESOPHAGOGASTRODUODENOSCOPY (EGD) WITH PROPOFOL;  Surgeon: [name], MD;  Location: [facility] ENDOSCOPY;  Service: Gastroenterology;  Laterality: N/A;   ROBOTIC ASSISTED LAPAROSCOPIC CHOLECYSTECTOMY N/A 04/23/2017   Procedure: ROBOTIC ASSISTED LAPAROSCOPIC CHOLECYSTECTOMY;  Surgeon: Jules Husbands, MD;  Location: [facility] ORS;  Service: General;  Laterality: N/A;    Medications:  Current Outpatient Medications on File Prior to Visit  Medication Sig   atorvastatin (LIPITOR) 20 MG tablet Take 1 tablet (20 mg total) by mouth every evening.   lisinopril (ZESTRIL) 5 MG tablet Take 1 tablet (5 mg total) by mouth every evening.   Semaglutide, 1 MG/DOSE, 4 MG/3ML SOPN Inject 1 mg as directed once a week.   No current facility-administered medications on file prior to visit.    Allergies:  No Known  Allergies  Social History:  Social History   Socioeconomic History   Marital status: Single    Spouse name: Not on file   Number of children: Not on file   Years of education: Not on file   Highest education level: Not on file  Occupational History   Not on file  Tobacco Use   Smoking status: Former    Packs/day: 1.50    Years: 22.00    Pack years: 33.00    Types: Cigarettes    Quit date: 03/2020    Years since quitting: 0.8    Smokeless tobacco: Never  Vaping Use   Vaping Use: Never used  Substance and Sexual Activity   Alcohol use: Not Currently    Alcohol/week: 5.0 standard drinks    Types: 4 Shots of liquor, 1 Standard drinks or equivalent per week   Drug use: No   Sexual activity: Yes    Birth control/protection: None  Other Topics Concern   Not on file  Social History Narrative   Not on file   Social Determinants of Health   Financial Resource Strain: Not on file  Food Insecurity: Not on file  Transportation Needs: Not on file  Physical Activity: Not on file  Stress: Not on file  Social Connections: Not on file  Intimate Partner Violence: Not on file   Social History   Tobacco Use  Smoking Status Former   Packs/day: 1.50   Years: 22.00   Pack years: 33.00   Types: Cigarettes   Quit date: 03/2020   Years since quitting: 0.8  Smokeless Tobacco Never   Social History   Substance and Sexual Activity  Alcohol Use Not Currently   Alcohol/week: 5.0 standard drinks   Types: 4 Shots of liquor, 1 Standard drinks or equivalent per week    Family History:  Family History  Problem Relation Age of Onset   Diabetes Mother    Hyperlipidemia Mother    Hypertension Mother     Past medical history, surgical history, medications, allergies, family history and social history reviewed with patient today and changes made to appropriate areas of the chart.   Review of Systems  HENT:         Denies vision changes.  Eyes:  Negative for blurred vision and double vision.  Respiratory:  Negative for shortness of breath.   Cardiovascular:  Negative for chest pain, palpitations and leg swelling.  Neurological:  Negative for dizziness, tingling and headaches.  Endo/Heme/Allergies:  Negative for polydipsia.       Denies Polyuria  All other ROS negative except what is listed above and in the HPI.      Objective:    BP 110/79    Pulse 86    Temp 98.7 F (37.1 C) (Oral)    Ht 5' (1.524 m)    Wt 240  lb 3.2 oz (109 kg)    SpO2 96%    BMI 46.91 kg/m   Wt Readings from Last 3 Encounters:  01/24/21 240 lb 3.2 oz (109 kg)  11/29/20 236 lb 6.4 oz (107.2 kg)  10/31/20 235 lb 12.8 oz (107 kg)    Physical Exam Vitals and nursing note reviewed.  Constitutional:      General: She is awake. She is not in acute distress.    Appearance: She is well-developed. She is obese. She is not ill-appearing.  HENT:     Head: Normocephalic and atraumatic.     Right Ear: Hearing, tympanic membrane, ear canal and  external ear normal. No drainage.     Left Ear: Hearing, tympanic membrane, ear canal and external ear normal. No drainage.     Nose: Nose normal.     Right Sinus: No maxillary sinus tenderness or frontal sinus tenderness.     Left Sinus: No maxillary sinus tenderness or frontal sinus tenderness.     Mouth/Throat:     Mouth: Mucous membranes are moist.     Pharynx: Oropharynx is clear. Uvula midline. No pharyngeal swelling, oropharyngeal exudate or posterior oropharyngeal erythema.  Eyes:     General: Lids are normal.        Right eye: No discharge.        Left eye: No discharge.     Extraocular Movements: Extraocular movements intact.     Conjunctiva/sclera: Conjunctivae normal.     Pupils: Pupils are equal, round, and reactive to light.     Visual Fields: Right eye visual fields normal and left eye visual fields normal.  Neck:     Thyroid: No thyromegaly.     Vascular: No carotid bruit.     Trachea: Trachea normal.  Cardiovascular:     Rate and Rhythm: Normal rate and regular rhythm.     Heart sounds: Normal heart sounds. No murmur heard.   No gallop.  Pulmonary:     Effort: Pulmonary effort is normal. No accessory muscle usage or respiratory distress.     Breath sounds: Normal breath sounds.  Chest:  Breasts:    Right: Normal.     Left: Normal.  Abdominal:     General: Bowel sounds are normal.     Palpations: Abdomen is soft. There is no hepatomegaly or splenomegaly.      Tenderness: There is no abdominal tenderness.  Musculoskeletal:        General: Normal range of motion.     Cervical back: Normal range of motion and neck supple.     Right lower leg: No edema.     Left lower leg: No edema.  Lymphadenopathy:     Head:     Right side of head: No submental, submandibular, tonsillar, preauricular or posterior auricular adenopathy.     Left side of head: No submental, submandibular, tonsillar, preauricular or posterior auricular adenopathy.     Cervical: No cervical adenopathy.     Upper Body:     Right upper body: No supraclavicular, axillary or pectoral adenopathy.     Left upper body: No supraclavicular, axillary or pectoral adenopathy.  Skin:    General: Skin is warm and dry.     Capillary Refill: Capillary refill takes less than 2 seconds.     Findings: No rash.  Neurological:     Mental Status: She is alert and oriented to person, place, and time.     Gait: Gait is intact.     Deep Tendon Reflexes: Reflexes are normal and symmetric.     Reflex Scores:      Brachioradialis reflexes are 2+ on the right side and 2+ on the left side.      Patellar reflexes are 2+ on the right side and 2+ on the left side. Psychiatric:        Attention and Perception: Attention normal.        Mood and Affect: Mood normal.        Speech: Speech normal.        Behavior: Behavior normal. Behavior is cooperative.        Thought Content: Thought content normal.  Judgment: Judgment normal.    Results for orders placed or performed in visit on 10/31/20  Urinalysis, Routine w reflex microscopic  Result Value Ref Range   Specific Gravity, UA 1.015 1.005 - 1.030   pH, UA 5.0 5.0 - 7.5   Color, UA Yellow Yellow   Appearance Ur Cloudy (A) Clear   Leukocytes,UA Negative Negative   Protein,UA Negative Negative/Trace   Glucose, UA 3+ (A) Negative   Ketones, UA Trace (A) Negative   RBC, UA Negative Negative   Bilirubin, UA Negative Negative   Urobilinogen, Ur 0.2  0.2 - 1.0 mg/dL   Nitrite, UA Negative Negative  Thyroid Panel With TSH  Result Value Ref Range   TSH 1.750 0.450 - 4.500 uIU/mL   T4, Total 9.3 4.5 - 12.0 ug/dL   T3 Uptake Ratio 25 24 - 39 %   Free Thyroxine Index 2.3 1.2 - 4.9      Assessment & Plan:   Problem List Items Addressed This Visit       Cardiovascular and Mediastinum   Essential hypertension--Lisinopril    Chronic.  Controlled.  Continue with current medication regimen on Lisinopril 5mg  daily.  Labs ordered today.  Return to clinic in 3 months for reevaluation.  Call sooner if concerns arise.          Endocrine   Type 2 diabetes mellitus (HCC)--[facility]    Chronic.  Last A1c was 10.4.  Patient is not checking sugars.  Will increase Ozempic to 2mg .  Can add Wilder Glade also.  Labs ordered today.  Will make recommendations based on lab results.  Follow up in 3 months.      Relevant Orders   HgB A1c   Microalbumin, Urine Waived     Other   Anxiety    Chronic.  Controlled.  Continue with current medication regimen.  Labs ordered today.  Return to clinic in 6 months for reevaluation.  Call sooner if concerns arise.        Morbid obesity ([place]) 260 lb    Recommend a healthy lifestyle through diet and exercise.  Recommend smaller meals that prioritize protein.       Relevant Orders   HgB A1c   Other Visit Diagnoses     Annual physical exam    -  Primary   Health maintenance reviewed. Labs ordered. Mammogram ordered. PAP done.   Relevant Orders   CBC with Differential/Platelet   Comprehensive metabolic panel   Lipid panel   TSH   Urinalysis, Routine w reflex microscopic   Cytology - PAP   Screening for ischemic heart disease       Relevant Orders   Lipid panel   Encounter for screening mammogram for malignant neoplasm of breast       Relevant Orders   MM Digital Screening        Follow up plan: Return in about 3 months (around 04/24/2021) for HTN, HLD, DM2 FU.   LABORATORY TESTING:  - Pap smear:  pap done  IMMUNIZATIONS:   - Tdap: Tetanus vaccination status reviewed: last tetanus booster within 10 years. - Influenza: Up to date - Pneumovax: Up to date - Prevnar: Not applicable - COVID: Up to date - HPV: Not applicable - Shingrix vaccine: Not applicable  SCREENING: -Mammogram: Ordered today  - Colonoscopy: Up to date  - Bone Density: Not applicable  -Hearing Test: Not applicable  -Spirometry: Not applicable   PATIENT COUNSELING:   Advised to take 1 mg of folate supplement per  day if capable of pregnancy.   Sexuality: Discussed sexually transmitted diseases, partner selection, use of condoms, avoidance of unintended pregnancy  and contraceptive alternatives.   Advised to avoid cigarette smoking.  I discussed with the patient that most people either abstain from alcohol or drink within safe limits (<=14/week and <=4 drinks/occasion for males, <=7/weeks and <= 3 drinks/occasion for females) and that the risk for alcohol disorders and other health effects rises proportionally with the number of drinks per week and how often a drinker exceeds daily limits.  Discussed cessation/primary prevention of drug use and availability of treatment for abuse.   Diet: Encouraged to adjust caloric intake to maintain  or achieve ideal body weight, to reduce intake of dietary saturated fat and total fat, to limit sodium intake by avoiding high sodium foods and not adding table salt, and to maintain adequate dietary potassium and calcium preferably from fresh fruits, vegetables, and low-fat dairy products.    stressed the importance of regular exercise  Injury prevention: Discussed safety belts, safety helmets, smoke detector, smoking near bedding or upholstery.   Dental health: Discussed importance of regular tooth brushing, flossing, and dental visits.    NEXT PREVENTATIVE PHYSICAL DUE IN 1 YEAR. Return in about 3 months (around 04/24/2021) for HTN, HLD, DM2 FU.

## 2021-01-24 ENCOUNTER — Other Ambulatory Visit: Payer: Self-pay

## 2021-01-24 ENCOUNTER — Encounter: Payer: Self-pay | Admitting: Nurse Practitioner

## 2021-01-24 ENCOUNTER — Other Ambulatory Visit (HOSPITAL_COMMUNITY)
Admission: RE | Admit: 2021-01-24 | Discharge: 2021-01-24 | Disposition: A | Discharge status: Home / Self Care | Payer: Self-pay | Source: Ambulatory Visit | Attending: Nurse Practitioner | Admitting: Nurse Practitioner

## 2021-01-24 ENCOUNTER — Ambulatory Visit (INDEPENDENT_AMBULATORY_CARE_PROVIDER_SITE_OTHER): Payer: 59 | Admitting: Nurse Practitioner

## 2021-01-24 VITALS — BP 110/79 | HR 86 | Temp 98.7°F | Ht 60.0 in | Wt 240.2 lb

## 2021-01-24 DIAGNOSIS — I10 Essential (primary) hypertension: Secondary | ICD-10-CM | POA: Diagnosis not present

## 2021-01-24 DIAGNOSIS — F419 Anxiety disorder, unspecified: Secondary | ICD-10-CM

## 2021-01-24 DIAGNOSIS — Z0000 Encounter for general adult medical examination without abnormal findings: Secondary | ICD-10-CM | POA: Diagnosis not present

## 2021-01-24 DIAGNOSIS — E1169 Type 2 diabetes mellitus with other specified complication: Secondary | ICD-10-CM

## 2021-01-24 DIAGNOSIS — Z136 Encounter for screening for cardiovascular disorders: Secondary | ICD-10-CM

## 2021-01-24 DIAGNOSIS — Z1231 Encounter for screening mammogram for malignant neoplasm of breast: Secondary | ICD-10-CM

## 2021-01-24 LAB — URINALYSIS, ROUTINE W REFLEX MICROSCOPIC
Bilirubin, UA: NEGATIVE
Glucose, UA: NEGATIVE
Ketones, UA: NEGATIVE
Leukocytes,UA: NEGATIVE
Nitrite, UA: NEGATIVE
Protein,UA: NEGATIVE
RBC, UA: NEGATIVE
Specific Gravity, UA: 1.025 (ref 1.005–1.030)
Urobilinogen, Ur: 2 mg/dL — ABNORMAL HIGH (ref 0.2–1.0)
pH, UA: 5.5 (ref 5.0–7.5)

## 2021-01-24 NOTE — Assessment & Plan Note (Deleted)
Chronic.  Last A1c was 10.4.  Patient is not checking sugars.  Will increase Ozempic to 2mg  if A1c is still above goal.  Can add Wilder Glade also.  Labs ordered today.  Will make recommendations based on lab results.  Follow up in 3 months.

## 2021-01-24 NOTE — Assessment & Plan Note (Signed)
Recommend a healthy lifestyle through diet and exercise.  Recommend smaller meals that prioritize protein.

## 2021-01-24 NOTE — Assessment & Plan Note (Signed)
Chronic.  Controlled.  Continue with current medication regimen on Lisinopril 5mg  daily.  Labs ordered today.  Return to clinic in 3 months for reevaluation.  Call sooner if concerns arise.

## 2021-01-24 NOTE — Assessment & Plan Note (Addendum)
Chronic.  Last A1c was 10.4.  Patient is not checking sugars.  Will increase Ozempic to 2mg .  Can add Wilder Glade also.  Labs ordered today.  Will make recommendations based on lab results.  Follow up in 3 months.

## 2021-01-24 NOTE — Assessment & Plan Note (Signed)
Chronic.  Controlled.  Continue with current medication regimen.  Labs ordered today.  Return to clinic in 6 months for reevaluation.  Call sooner if concerns arise.  ? ?

## 2021-01-25 ENCOUNTER — Encounter: Payer: Self-pay | Admitting: Nurse Practitioner

## 2021-01-25 LAB — LIPID PANEL
Chol/HDL Ratio: 1.8 ratio (ref 0.0–4.4)
Cholesterol, Total: 139 mg/dL (ref 100–199)
HDL: 76 mg/dL
LDL Chol Calc (NIH): 50 mg/dL (ref 0–99)
Triglycerides: 64 mg/dL (ref 0–149)
VLDL Cholesterol Cal: 13 mg/dL (ref 5–40)

## 2021-01-25 LAB — COMPREHENSIVE METABOLIC PANEL WITH GFR
ALT: 22 IU/L (ref 0–32)
AST: 13 IU/L (ref 0–40)
Albumin/Globulin Ratio: 1.6 (ref 1.2–2.2)
Albumin: 4.4 g/dL (ref 3.8–4.8)
Alkaline Phosphatase: 125 IU/L — ABNORMAL HIGH (ref 44–121)
BUN/Creatinine Ratio: 14 (ref 9–23)
BUN: 12 mg/dL (ref 6–24)
Bilirubin Total: 0.5 mg/dL (ref 0.0–1.2)
CO2: 22 mmol/L (ref 20–29)
Calcium: 9.6 mg/dL (ref 8.7–10.2)
Chloride: 99 mmol/L (ref 96–106)
Creatinine, Ser: 0.84 mg/dL (ref 0.57–1.00)
Globulin, Total: 2.7 g/dL (ref 1.5–4.5)
Glucose: 192 mg/dL — ABNORMAL HIGH (ref 70–99)
Potassium: 4.5 mmol/L (ref 3.5–5.2)
Sodium: 138 mmol/L (ref 134–144)
Total Protein: 7.1 g/dL (ref 6.0–8.5)
eGFR: 87 mL/min/1.73

## 2021-01-25 LAB — CBC WITH DIFFERENTIAL/PLATELET
Basophils Absolute: 0.1 x10E3/uL (ref 0.0–0.2)
Basos: 1 %
EOS (ABSOLUTE): 0 x10E3/uL (ref 0.0–0.4)
Eos: 1 %
Hematocrit: 37.6 % (ref 34.0–46.6)
Hemoglobin: 12.7 g/dL (ref 11.1–15.9)
Immature Grans (Abs): 0 x10E3/uL (ref 0.0–0.1)
Immature Granulocytes: 0 %
Lymphocytes Absolute: 1.6 x10E3/uL (ref 0.7–3.1)
Lymphs: 23 %
MCH: 30.5 pg (ref 26.6–33.0)
MCHC: 33.8 g/dL (ref 31.5–35.7)
MCV: 90 fL (ref 79–97)
Monocytes Absolute: 0.5 x10E3/uL (ref 0.1–0.9)
Monocytes: 7 %
Neutrophils Absolute: 4.6 x10E3/uL (ref 1.4–7.0)
Neutrophils: 68 %
Platelets: 311 x10E3/uL (ref 150–450)
RBC: 4.16 x10E6/uL (ref 3.77–5.28)
RDW: 11.3 % — ABNORMAL LOW (ref 11.7–15.4)
WBC: 6.7 x10E3/uL (ref 3.4–10.8)

## 2021-01-25 LAB — HEMOGLOBIN A1C
Est. average glucose Bld gHb Est-mCnc: 177 mg/dL
Hgb A1c MFr Bld: 7.8 % — ABNORMAL HIGH (ref 4.8–5.6)

## 2021-01-25 LAB — TSH: TSH: 1.07 u[IU]/mL (ref 0.450–4.500)

## 2021-01-25 MED ORDER — DAPAGLIFLOZIN PROPANEDIOL 10 MG PO TABS: 10.0000 mg | ORAL_TABLET | Freq: Every day | ORAL | 1 refills | Status: DC

## 2021-01-25 NOTE — Addendum Note (Signed)
Addended by: Jon Billings on: 01/25/2021 08:09 AM   Modules accepted: Orders

## 2021-01-25 NOTE — Progress Notes (Signed)
Please let patient know that overall her lab work looks good.  Her A1c improved from 10.4 to 7.8 which is great news.  I think we should add Farxiga 10mg  to her regimen to make sure she gets under our goal of 7.0.  I have sent that into the pharmacy.  Otherwise he lab work looks great.  We will recheck this in 6 months.

## 2021-01-29 LAB — CYTOLOGY - PAP: Diagnosis: NEGATIVE

## 2021-01-29 NOTE — Progress Notes (Signed)
Hi Ms. Carmen [name]. Your Pap was normal.  We will repeat it in 5 years.

## 2021-02-13 ENCOUNTER — Encounter: Payer: Self-pay | Admitting: Nurse Practitioner

## 2021-02-16 ENCOUNTER — Other Ambulatory Visit: Payer: Self-pay | Admitting: Nurse Practitioner

## 2021-02-16 LAB — HM DIABETES EYE EXAM

## 2021-02-16 NOTE — Telephone Encounter (Signed)
Alternative med- needs prior authorization.

## 2021-02-19 ENCOUNTER — Encounter: Payer: Self-pay | Admitting: Nurse Practitioner

## 2021-02-19 NOTE — Telephone Encounter (Signed)
Prior authorization initiated via CoverMyMeds.  [id number]

## 2021-03-18 ENCOUNTER — Telehealth: Payer: Self-pay | Admitting: Nurse Practitioner

## 2021-03-18 ENCOUNTER — Encounter: Payer: Self-pay | Admitting: Nurse Practitioner

## 2021-03-18 ENCOUNTER — Other Ambulatory Visit: Payer: Self-pay | Admitting: Nurse Practitioner

## 2021-03-18 DIAGNOSIS — E1169 Type 2 diabetes mellitus with other specified complication: Secondary | ICD-10-CM

## 2021-03-18 MED ORDER — TRULICITY 0.75 MG/0.5ML ~~LOC~~ SOAJ: 0.7500 mg | SUBCUTANEOUS | 0 refills | Status: DC

## 2021-03-18 NOTE — Telephone Encounter (Signed)
Filled 90 day supply 01/26/21 Requested Prescriptions  Pending Prescriptions Disp Refills   atorvastatin (LIPITOR) 20 MG tablet [Pharmacy Med Name: ATORVASTATIN 20 MG TABLET] 90 tablet 1    Sig: TAKE 1 TABLET BY MOUTH EVERY DAY IN THE EVENING     Cardiovascular:  Antilipid - Statins Failed - 03/18/2021  5:10 PM      Failed - Lipid Panel in normal range within the last 12 months    Cholesterol, Total  Date Value Ref Range Status  01/24/2021 139 100 - 199 mg/dL Final   LDL Chol Calc (NIH)  Date Value Ref Range Status  01/24/2021 50 0 - 99 mg/dL Final   HDL  Date Value Ref Range Status  01/24/2021 76 >39 mg/dL Final   Triglycerides  Date Value Ref Range Status  01/24/2021 64 0 - 149 mg/dL Final         Passed - Patient is not pregnant      Passed - Valid encounter within last 12 months    Recent Outpatient Visits          1 month ago Annual physical exam   [facility] Jon Billings, NP   3 months ago Type 2 diabetes mellitus with other specified complication, without long-term current use of insulin ([place])   [place], [place], NP   4 months ago Acute bilateral low back pain, unspecified whether sciatica present   [facility] Jon Billings, NP   4 months ago Type 2 diabetes mellitus with other specified complication, without long-term current use of insulin ([place])   [facility] Jon Billings, NP      Future Appointments            In 1 month Jon Billings, NP [facility], [place]

## 2021-03-18 NOTE — Telephone Encounter (Signed)
Max from Cover My Meds Called about PA needed for Ozempic/ please advise   Ref # [id number]

## 2021-03-19 NOTE — Telephone Encounter (Signed)
PA for Trulicity initiated and submitted via Cover My Meds. Key: [id number]

## 2021-03-20 NOTE — Telephone Encounter (Signed)
Patient was notified via MyChart regarding the approval of Trulicity prescription. Advised patient to give our office a call back if she has a question or concerns.

## 2021-04-18 ENCOUNTER — Other Ambulatory Visit: Payer: Self-pay | Admitting: Nurse Practitioner

## 2021-04-18 MED ORDER — TRULICITY 0.75 MG/0.5ML ~~LOC~~ SOAJ: 0.7500 mg | SUBCUTANEOUS | 0 refills | Status: DC

## 2021-04-22 ENCOUNTER — Other Ambulatory Visit: Payer: Self-pay | Admitting: Nurse Practitioner

## 2021-04-22 DIAGNOSIS — I10 Essential (primary) hypertension: Secondary | ICD-10-CM

## 2021-04-23 NOTE — Telephone Encounter (Signed)
Requested Prescriptions  ?Pending Prescriptions Disp Refills  ?? lisinopril (ZESTRIL) 5 MG tablet [Pharmacy Med Name: LISINOPRIL 5 MG TABLET] 90 tablet 1  ?  Sig: TAKE 1 TABLET BY MOUTH EVERY DAY IN THE EVENING  ?  ? Cardiovascular:  ACE Inhibitors Passed - 04/22/2021  3:08 PM  ?  ?  Passed - Cr in normal range and within 180 days  ?  Creatinine, Ser  ?Date Value Ref Range Status  ?01/24/2021 0.84 0.57 - 1.00 mg/dL Final  ?   ?  ?  Passed - K in normal range and within 180 days  ?  Potassium  ?Date Value Ref Range Status  ?01/24/2021 4.5 3.5 - 5.2 mmol/L Final  ?   ?  ?  Passed - Patient is not pregnant  ?  ?  Passed - Last BP in normal range  ?  BP Readings from Last 1 Encounters:  ?01/24/21 110/79  ?   ?  ?  Passed - Valid encounter within last 6 months  ?  Recent Outpatient Visits   ?      ? 2 months ago Annual physical exam  ? [place], NP  ? 4 months ago Type 2 diabetes mellitus with other specified complication, without long-term current use of insulin ([place])  ? [place], NP  ? 5 months ago Acute bilateral low back pain, unspecified whether sciatica present  ? [place], NP  ? 5 months ago Type 2 diabetes mellitus with other specified complication, without long-term current use of insulin ([place])  ? [facility] Jon Billings, NP  ?  ?  ?Future Appointments   ?        ? In 1 week Jon Billings, NP [facility] [place], [facility]  ?  ? ?  ?  ?  ? ?

## 2021-04-24 ENCOUNTER — Ambulatory Visit: Payer: Self-pay | Admitting: Nurse Practitioner

## 2021-05-03 ENCOUNTER — Other Ambulatory Visit: Payer: Self-pay

## 2021-05-03 ENCOUNTER — Ambulatory Visit (INDEPENDENT_AMBULATORY_CARE_PROVIDER_SITE_OTHER): Payer: 59 | Admitting: Nurse Practitioner

## 2021-05-03 ENCOUNTER — Encounter: Payer: Self-pay | Admitting: Nurse Practitioner

## 2021-05-03 VITALS — BP 127/79 | HR 90 | Temp 99.0°F | Wt 237.0 lb

## 2021-05-03 DIAGNOSIS — E1169 Type 2 diabetes mellitus with other specified complication: Secondary | ICD-10-CM | POA: Diagnosis not present

## 2021-05-03 DIAGNOSIS — Z136 Encounter for screening for cardiovascular disorders: Secondary | ICD-10-CM | POA: Diagnosis not present

## 2021-05-03 DIAGNOSIS — F419 Anxiety disorder, unspecified: Secondary | ICD-10-CM

## 2021-05-03 DIAGNOSIS — I10 Essential (primary) hypertension: Secondary | ICD-10-CM

## 2021-05-03 DIAGNOSIS — R69 Illness, unspecified: Secondary | ICD-10-CM | POA: Diagnosis not present

## 2021-05-03 NOTE — Assessment & Plan Note (Signed)
Chronic.  Controlled.  Continue with current medication regimen.  Labs ordered today.  Return to clinic in 6 months for reevaluation.  Call sooner if concerns arise.  ? ?

## 2021-05-03 NOTE — Progress Notes (Signed)
? ?BP 127/79   Pulse 90   Temp 99 ?F (37.2 ?C) (Oral)   Wt 237 lb (107.5 kg)   SpO2 98%   BMI 46.29 kg/m?   ? ?Subjective:  ? ? Patient ID: Carmen [name], female    DOB: 01-07-1976, 46 y.o.   MRN: [id number] ? ?HPI: ?Carmen [name] is a 46 y.o. female ? ?Chief Complaint  ?Patient presents with  ? Diabetes  ? Hyperlipidemia  ? Hypertension  ? ?HYPERTENSION / HYPERLIPIDEMIA ?Satisfied with current treatment? yes ?Duration of hypertension: years ?BP monitoring frequency: not checking ?BP range:  ?BP medication side effects: no ?Past BP meds: lisinopril ?Duration of hyperlipidemia: years ?Cholesterol medication side effects: no ?Cholesterol supplements: none ?Past cholesterol medications: atorvastain (lipitor) ?Medication compliance: excellent compliance ?Aspirin: no ?Recent stressors: no ?Recurrent headaches: no ?Visual changes: no ?Palpitations: no ?Dyspnea: no ?Chest pain: no ?Lower extremity edema: no ?Dizzy/lightheaded: no ? ?DIABETES ?Hypoglycemic episodes:no ?Polydipsia/polyuria: no ?Visual disturbance: no ?Chest pain: no ?Paresthesias: no ?Glucose Monitoring: no ? Accucheck frequency: Not Checking ? Fasting glucose: ? Post prandial: ? Evening: ? Before meals: ?Taking Insulin?: no ? Long acting insulin: ? Short acting insulin: ?Blood Pressure Monitoring: not checking ?Retinal Examination: Not up to Date ?Foot Exam: Up to Date ?Diabetic Education: Not Completed ?Pneumovax: Up to Date ?Influenza: Not up to Date ?Aspirin: no ? ?Patient is continuing on her weight loss journey. She has been working on diet and exercise and trying to get her diabetes under control.  ? ? ?Relevant past medical, surgical, family and social history reviewed and updated as indicated. Interim medical history since our last visit reviewed. ?Allergies and medications reviewed and updated. ? ?Review of Systems  ?Eyes:  Negative for visual disturbance.  ?Respiratory:  Negative for cough, chest tightness and shortness of  breath.   ?Cardiovascular:  Negative for chest pain, palpitations and leg swelling.  ?Endocrine: Negative for polydipsia and polyuria.  ?Neurological:  Negative for dizziness, numbness and headaches.  ? ?Per HPI unless specifically indicated above ? ?   ?Objective:  ?  ?BP 127/79   Pulse 90   Temp 99 ?F (37.2 ?C) (Oral)   Wt 237 lb (107.5 kg)   SpO2 98%   BMI 46.29 kg/m?   ?Wt Readings from Last 3 Encounters:  ?05/03/21 237 lb (107.5 kg)  ?01/24/21 240 lb 3.2 oz (109 kg)  ?11/29/20 236 lb 6.4 oz (107.2 kg)  ?  ?Physical Exam ?Vitals and nursing note reviewed.  ?Constitutional:   ?   General: She is not in acute distress. ?   Appearance: Normal appearance. She is obese. She is not ill-appearing, toxic-appearing or diaphoretic.  ?HENT:  ?   Head: Normocephalic.  ?   Right Ear: External ear normal.  ?   Left Ear: External ear normal.  ?   Nose: Nose normal.  ?   Mouth/Throat:  ?   Mouth: Mucous membranes are moist.  ?   Pharynx: Oropharynx is clear.  ?Eyes:  ?   General:     ?   Right eye: No discharge.     ?   Left eye: No discharge.  ?   Extraocular Movements: Extraocular movements intact.  ?   Conjunctiva/sclera: Conjunctivae normal.  ?   Pupils: Pupils are equal, round, and reactive to light.  ?Cardiovascular:  ?   Rate and Rhythm: Normal rate and regular rhythm.  ?   Heart sounds: No murmur heard. ?Pulmonary:  ?   Effort: Pulmonary effort is  normal. No respiratory distress.  ?   Breath sounds: Normal breath sounds. No wheezing or rales.  ?Musculoskeletal:  ?   Cervical back: Normal range of motion and neck supple.  ?Skin: ?   General: Skin is warm and dry.  ?   Capillary Refill: Capillary refill takes less than 2 seconds.  ?Neurological:  ?   General: No focal deficit present.  ?   Mental Status: She is alert and oriented to person, place, and time. Mental status is at baseline.  ?Psychiatric:     ?   Mood and Affect: Mood normal.     ?   Behavior: Behavior normal.     ?   Thought Content: Thought content  normal.     ?   Judgment: Judgment normal.  ? ? ?Results for orders placed or performed in visit on 01/24/21  ?CBC with Differential/Platelet  ?Result Value Ref Range  ? WBC 6.7 3.4 - 10.8 x10E3/uL  ? RBC 4.16 3.77 - 5.28 x10E6/uL  ? Hemoglobin 12.7 11.1 - 15.9 g/dL  ? Hematocrit 37.6 34.0 - 46.6 %  ? MCV 90 79 - 97 fL  ? MCH 30.5 26.6 - 33.0 pg  ? MCHC 33.8 31.5 - 35.7 g/dL  ? RDW 11.3 (L) 11.7 - 15.4 %  ? Platelets 311 150 - 450 x10E3/uL  ? Neutrophils 68 Not Estab. %  ? Lymphs 23 Not Estab. %  ? Monocytes 7 Not Estab. %  ? Eos 1 Not Estab. %  ? Basos 1 Not Estab. %  ? Neutrophils Absolute 4.6 1.4 - 7.0 x10E3/uL  ? Lymphocytes Absolute 1.6 0.7 - 3.1 x10E3/uL  ? Monocytes Absolute 0.5 0.1 - 0.9 x10E3/uL  ? EOS (ABSOLUTE) 0.0 0.0 - 0.4 x10E3/uL  ? Basophils Absolute 0.1 0.0 - 0.2 x10E3/uL  ? Immature Granulocytes 0 Not Estab. %  ? Immature Grans (Abs) 0.0 0.0 - 0.1 x10E3/uL  ?Comprehensive metabolic panel  ?Result Value Ref Range  ? Glucose 192 (H) 70 - 99 mg/dL  ? BUN 12 6 - 24 mg/dL  ? Creatinine, Ser 0.84 0.57 - 1.00 mg/dL  ? eGFR 87 >59 mL/min/1.73  ? BUN/Creatinine Ratio 14 9 - 23  ? Sodium 138 134 - 144 mmol/L  ? Potassium 4.5 3.5 - 5.2 mmol/L  ? Chloride 99 96 - 106 mmol/L  ? CO2 22 20 - 29 mmol/L  ? Calcium 9.6 8.7 - 10.2 mg/dL  ? Total Protein 7.1 6.0 - 8.5 g/dL  ? Albumin 4.4 3.8 - 4.8 g/dL  ? Globulin, Total 2.7 1.5 - 4.5 g/dL  ? Albumin/Globulin Ratio 1.6 1.2 - 2.2  ? Bilirubin Total 0.5 0.0 - 1.2 mg/dL  ? Alkaline Phosphatase 125 (H) 44 - 121 IU/L  ? AST 13 0 - 40 IU/L  ? ALT 22 0 - 32 IU/L  ?Lipid panel  ?Result Value Ref Range  ? Cholesterol, Total 139 100 - 199 mg/dL  ? Triglycerides 64 0 - 149 mg/dL  ? HDL 76 >39 mg/dL  ? VLDL Cholesterol Cal 13 5 - 40 mg/dL  ? LDL Chol Calc (NIH) 50 0 - 99 mg/dL  ? Chol/HDL Ratio 1.8 0.0 - 4.4 ratio  ?TSH  ?Result Value Ref Range  ? TSH 1.070 0.450 - 4.500 uIU/mL  ?Urinalysis, Routine w reflex microscopic  ?Result Value Ref Range  ? Specific Gravity, UA 1.025 1.005  - 1.030  ? pH, UA 5.5 5.0 - 7.5  ? Color, UA Yellow Yellow  ? Appearance  Ur Clear Clear  ? Leukocytes,UA Negative Negative  ? Protein,UA Negative Negative/Trace  ? Glucose, UA Negative Negative  ? Ketones, UA Negative Negative  ? RBC, UA Negative Negative  ? Bilirubin, UA Negative Negative  ? Urobilinogen, Ur 2.0 (H) 0.2 - 1.0 mg/dL  ? Nitrite, UA Negative Negative  ?HgB A1c  ?Result Value Ref Range  ? Hgb A1c MFr Bld 7.8 (H) 4.8 - 5.6 %  ? Est. average glucose Bld gHb Est-mCnc 177 mg/dL  ?Cytology - PAP  ?Result Value Ref Range  ? Adequacy    ?  Satisfactory for evaluation; transformation zone component PRESENT.  ? Diagnosis    ?  - Negative for intraepithelial lesion or malignancy (NILM)  ? ?   ?Assessment & Plan:  ? ?Problem List Items Addressed This Visit   ? ?  ? Cardiovascular and Mediastinum  ? Essential hypertension--Lisinopril - Primary  ? Relevant Orders  ? Comp Met (CMET)  ?  ? Endocrine  ? Type 2 diabetes mellitus (HCC)--[place]  ?  Chronic.  Last A1c was 7.8.  Patient is not checking sugars.  Continue with Trulicity 1.97JO weekly.  Will increase the dose of A1c is still not well controlled. Continue with [place].  Labs ordered today.  Will make recommendations based on lab results.  Follow up in 3 months. ?  ?  ? Relevant Orders  ? HgB A1c  ?  ? Other  ? Anxiety  ?  Chronic.  Controlled.  Continue with current medication regimen.  Labs ordered today.  Return to clinic in 6 months for reevaluation.  Call sooner if concerns arise.  ? ?  ?  ? Morbid obesity (HCC) 260 lb  ?  Chronic. Continues on weight journey. Encouraged to continue with diet and exercise. Has lost 3lbs since last visit. Praised for weight loss.  Follow up in 3 months for reevaluation.  ?  ?  ? Relevant Orders  ? Amb Referral to Bariatric Surgery  ? ?Other Visit Diagnoses   ? ? Screening for ischemic heart disease      ? Relevant Orders  ? Lipid Profile  ? ?  ?  ? ?Follow up plan: ?Return in about 3 months (around 08/03/2021) for HTN,  HLD, DM2 FU. ? ? ? ? ? ?

## 2021-05-03 NOTE — Assessment & Plan Note (Signed)
Chronic. Continues on weight journey. Encouraged to continue with diet and exercise. Has lost 3lbs since last visit. Praised for weight loss.  Follow up in 3 months for reevaluation.  ?

## 2021-05-03 NOTE — Assessment & Plan Note (Signed)
Chronic.  Last A1c was 7.8.  Patient is not checking sugars.  Continue with Trulicity 0.'75mg'$  weekly.  Will increase the dose of A1c is still not well controlled. Continue with [place].  Labs ordered today.  Will make recommendations based on lab results.  Follow up in 3 months. ?

## 2021-05-04 LAB — HEMOGLOBIN A1C
Est. average glucose Bld gHb Est-mCnc: 180 mg/dL
Hgb A1c MFr Bld: 7.9 % — ABNORMAL HIGH (ref 4.8–5.6)

## 2021-05-04 LAB — COMPREHENSIVE METABOLIC PANEL
ALT: 24 IU/L (ref 0–32)
AST: 13 IU/L (ref 0–40)
Albumin/Globulin Ratio: 1.5 (ref 1.2–2.2)
Albumin: 4.1 g/dL (ref 3.8–4.8)
Alkaline Phosphatase: 131 IU/L — ABNORMAL HIGH (ref 44–121)
BUN/Creatinine Ratio: 14 (ref 9–23)
BUN: 11 mg/dL (ref 6–24)
Bilirubin Total: 0.4 mg/dL (ref 0.0–1.2)
CO2: 21 mmol/L (ref 20–29)
Calcium: 9.3 mg/dL (ref 8.7–10.2)
Chloride: 103 mmol/L (ref 96–106)
Creatinine, Ser: 0.8 mg/dL (ref 0.57–1.00)
Globulin, Total: 2.7 g/dL (ref 1.5–4.5)
Glucose: 212 mg/dL — ABNORMAL HIGH (ref 70–99)
Potassium: 4.2 mmol/L (ref 3.5–5.2)
Sodium: 139 mmol/L (ref 134–144)
Total Protein: 6.8 g/dL (ref 6.0–8.5)
eGFR: 93 mL/min/{1.73_m2} (ref >59)

## 2021-05-04 LAB — LIPID PANEL
Chol/HDL Ratio: 1.7 ratio (ref 0.0–4.4)
Cholesterol, Total: 123 mg/dL (ref 100–199)
HDL: 72 mg/dL (ref >39)
LDL Chol Calc (NIH): 30 mg/dL (ref 0–99)
Triglycerides: 123 mg/dL (ref 0–149)
VLDL Cholesterol Cal: 21 mg/dL (ref 5–40)

## 2021-05-06 MED ORDER — TRULICITY 1.5 MG/0.5ML ~~LOC~~ SOAJ: 1.5000 mg | SUBCUTANEOUS | 1 refills | Status: DC

## 2021-05-06 NOTE — Addendum Note (Signed)
Addended by: Jon Billings on: 05/06/2021 08:00 AM ? ? Modules accepted: Orders ? ?

## 2021-05-06 NOTE — Progress Notes (Signed)
Please let patient know that her lab work shows that her A1c increased slightly to 7.9.  I am going to increase her Trulicity.  I placed the referral for bariatric surgery and she should be hearing from someone about an appointment.  Her other lab work looks good.  No other concerns at this time.

## 2021-07-24 ENCOUNTER — Encounter: Payer: Self-pay | Admitting: Nurse Practitioner

## 2021-07-24 MED ORDER — TRULICITY 1.5 MG/0.5ML ~~LOC~~ SOAJ: 1.5000 mg | SUBCUTANEOUS | 1 refills | Status: DC

## 2021-07-30 ENCOUNTER — Encounter: Payer: Self-pay | Admitting: Nurse Practitioner

## 2021-07-31 ENCOUNTER — Telehealth: Payer: Self-pay

## 2021-07-31 ENCOUNTER — Other Ambulatory Visit: Payer: Self-pay | Admitting: Nurse Practitioner

## 2021-07-31 DIAGNOSIS — E1169 Type 2 diabetes mellitus with other specified complication: Secondary | ICD-10-CM

## 2021-07-31 NOTE — Telephone Encounter (Signed)
PA for Trulicity has been initiated through covermymeds. Awaiting determination   Key: [id number]

## 2021-08-01 NOTE — Telephone Encounter (Signed)
Requested Prescriptions  Pending Prescriptions Disp Refills  . atorvastatin (LIPITOR) 20 MG tablet [Pharmacy Med Name: ATORVASTATIN 20 MG TABLET] 90 tablet 2    Sig: TAKE 1 TABLET BY MOUTH EVERY DAY IN THE EVENING     Cardiovascular:  Antilipid - Statins Failed - 07/31/2021  4:10 PM      Failed - Lipid Panel in normal range within the last 12 months    Cholesterol, Total  Date Value Ref Range Status  05/03/2021 123 100 - 199 mg/dL Final   LDL Chol Calc (NIH)  Date Value Ref Range Status  05/03/2021 30 0 - 99 mg/dL Final   HDL  Date Value Ref Range Status  05/03/2021 72 >39 mg/dL Final   Triglycerides  Date Value Ref Range Status  05/03/2021 123 0 - 149 mg/dL Final         Passed - Patient is not pregnant      Passed - Valid encounter within last 12 months    Recent Outpatient Visits          3 months ago Essential hypertension--Lisinopril   [facility] Jon Billings, NP   6 months ago Annual physical exam   [facility] Jon Billings, NP   8 months ago Type 2 diabetes mellitus with other specified complication, without long-term current use of insulin ([place])   [facility] Jon Billings, NP   9 months ago Acute bilateral low back pain, unspecified whether sciatica present   [facility] Jon Billings, NP   9 months ago Type 2 diabetes mellitus with other specified complication, without long-term current use of insulin ([place])   [facility] Jon Billings, NP      Future Appointments            In 4 days Jon Billings, NP [facility], [place]

## 2021-08-05 ENCOUNTER — Ambulatory Visit: Payer: 59 | Admitting: Nurse Practitioner

## 2021-08-05 ENCOUNTER — Encounter: Payer: Self-pay | Admitting: Nurse Practitioner

## 2021-08-05 VITALS — BP 130/88 | HR 89 | Temp 99.3°F | Wt 243.4 lb

## 2021-08-05 DIAGNOSIS — I10 Essential (primary) hypertension: Secondary | ICD-10-CM | POA: Diagnosis not present

## 2021-08-05 DIAGNOSIS — F419 Anxiety disorder, unspecified: Secondary | ICD-10-CM

## 2021-08-05 DIAGNOSIS — E1169 Type 2 diabetes mellitus with other specified complication: Secondary | ICD-10-CM

## 2021-08-05 MED ORDER — TRULICITY 1.5 MG/0.5ML ~~LOC~~ SOAJ: 1.5000 mg | SUBCUTANEOUS | 1 refills | Status: DC

## 2021-08-05 MED ORDER — LISINOPRIL 5 MG PO TABS: ORAL_TABLET | ORAL | 1 refills | Status: DC

## 2021-08-05 MED ORDER — DAPAGLIFLOZIN PROPANEDIOL 10 MG PO TABS
10.0000 mg | ORAL_TABLET | Freq: Every day | ORAL | 1 refills | Status: DC
Start: 2021-08-05 — End: 2022-02-19

## 2021-08-05 MED ORDER — ATORVASTATIN CALCIUM 20 MG PO TABS: ORAL_TABLET | ORAL | 1 refills | Status: DC

## 2021-08-05 NOTE — Assessment & Plan Note (Signed)
Chronic.  Controlled.  Continue with current medication regimen of Trulicity 1.5mg  and Farxiga 10mg .  Labs ordered today.  Return to clinic in 3 months for reevaluation.  Call sooner if concerns arise.

## 2021-08-05 NOTE — Assessment & Plan Note (Addendum)
Chronic.  Controlled.  Continue with current medication regimen of Lisinopril 5mg .  Labs ordered today.  Return to clinic in 3 months for reevaluation.  Call sooner if concerns arise.

## 2021-08-06 LAB — COMPREHENSIVE METABOLIC PANEL
ALT: 25 IU/L (ref 0–32)
AST: 12 IU/L (ref 0–40)
Albumin/Globulin Ratio: 1.7 (ref 1.2–2.2)
Albumin: 4 g/dL (ref 3.8–4.8)
Alkaline Phosphatase: 129 IU/L — ABNORMAL HIGH (ref 44–121)
BUN/Creatinine Ratio: 12 (ref 9–23)
BUN: 10 mg/dL (ref 6–24)
Bilirubin Total: 0.3 mg/dL (ref 0.0–1.2)
CO2: 21 mmol/L (ref 20–29)
Calcium: 9.3 mg/dL (ref 8.7–10.2)
Chloride: 100 mmol/L (ref 96–106)
Creatinine, Ser: 0.84 mg/dL (ref 0.57–1.00)
Globulin, Total: 2.4 g/dL (ref 1.5–4.5)
Glucose: 318 mg/dL — ABNORMAL HIGH (ref 70–99)
Potassium: 4.2 mmol/L (ref 3.5–5.2)
Sodium: 136 mmol/L (ref 134–144)
Total Protein: 6.4 g/dL (ref 6.0–8.5)
eGFR: 87 mL/min/{1.73_m2} (ref >59)

## 2021-08-06 LAB — HEMOGLOBIN A1C
Est. average glucose Bld gHb Est-mCnc: 189 mg/dL
Hgb A1c MFr Bld: 8.2 % — ABNORMAL HIGH (ref 4.8–5.6)

## 2021-08-09 ENCOUNTER — Telehealth: Payer: Self-pay

## 2021-08-09 NOTE — Telephone Encounter (Signed)
PA for Trulicity has been initiated through covermymeds. Awaiting determination  Key: [id number]

## 2021-08-09 NOTE — Telephone Encounter (Signed)
See other telephone encounter. No PA for  farxiga req per covermymeds.

## 2021-08-11 ENCOUNTER — Encounter: Payer: Self-pay | Admitting: Nurse Practitioner

## 2021-08-14 ENCOUNTER — Telehealth: Payer: Self-pay

## 2021-08-14 NOTE — Telephone Encounter (Signed)
Tyler from covermymeds is telling me now he is unable to fax an appeal form for either medication since it was neither denied or approved. The message we received was to tell us patient is trying to fill trulicity too soon. Dorothea Ogle was unsure about the farxiga as he did not see anything on his end I have told Dorothea Ogle this is the 3rd or 4 th person to tell me something different.

## 2021-08-14 NOTE — Telephone Encounter (Signed)
PA for Trulicity and Wilder Glade has been approved from 07/13/21 through 08/14/22. Sherron Ales has been a great help from cover my meds.   Case ID for Trulicity: [id number] Case ID for Wilder Glade: [id number]  Patient was on 3 way call with cover my meds and I through the entire 34 minutes of the phone call.

## 2021-08-14 NOTE — Telephone Encounter (Signed)
Please let patient know that her Trulicity was being filled too soon which is why they didn't approve it.  Let her know we have talked 3 different people and keep getting a different answer each time regarding her [place].  Please ask her to call her insurance and find out why this isn't being approved.

## 2021-08-14 NOTE — Telephone Encounter (Signed)
Carmen [name], both medications farxiga and trulicity were "denied"  due to missing information- I spoke with them last week and I have been awaiting a fax that I have yet to receive, denial letter attached an appeal form if you would like to appeal their decision. Please advise, the denial letters were put in your Review folder.

## 2021-08-14 NOTE — Telephone Encounter (Signed)
See mychart message encounter. Attempted to call patient 2 X unable to LVM.

## 2021-08-14 NOTE — Telephone Encounter (Signed)
Please appeal the request.  Please let insurance company know that we never received additional information to complete for the medications.

## 2021-08-15 NOTE — Telephone Encounter (Signed)
Thank you for the update!

## 2021-11-06 ENCOUNTER — Ambulatory Visit: Payer: 59 | Admitting: Nurse Practitioner

## 2021-11-06 ENCOUNTER — Encounter: Payer: Self-pay | Admitting: Nurse Practitioner

## 2021-11-06 VITALS — BP 130/89 | HR 86 | Temp 98.4°F | Wt 238.0 lb

## 2021-11-06 DIAGNOSIS — Z23 Encounter for immunization: Secondary | ICD-10-CM | POA: Diagnosis not present

## 2021-11-06 DIAGNOSIS — I10 Essential (primary) hypertension: Secondary | ICD-10-CM | POA: Diagnosis not present

## 2021-11-06 DIAGNOSIS — E1169 Type 2 diabetes mellitus with other specified complication: Secondary | ICD-10-CM

## 2021-11-06 NOTE — Assessment & Plan Note (Signed)
BP controlled.  No changes to current regimen at present time. Obtaining BMP and microalbumin to assess kidney function.  Pt encouraged to continue eating healthier diet and increasing physical activity.

## 2021-11-06 NOTE — Progress Notes (Signed)
BP 130/89   Pulse 86   Temp 98.4 F (36.9 C) (Oral)   Wt 108 kg   SpO2 98%   BMI 46.48 kg/m    Subjective:    Patient ID: Carmen [name], female    DOB: Jun 02, 1975, 46 y.o.   MRN: [id number]  HPI: Carmen [name] is a 46 y.o. female  [place].  ASSESSMENT AND PLAN OF CARE REVIEWED WITH STUDENT, AGREE WITH ABOVE FINDINGS AND PLAN.  Chief Complaint  Patient presents with   Hypertension   Hyperlipidemia   Diabetes    3 month follow up    Diabetic Eye Exam    [facility] [place], will obtain records.    HYPERTENSION / HYPERLIPIDEMIA Satisfied with current treatment? yes Duration of hypertension: years BP monitoring frequency: not checking BP range:  BP medication side effects: no  Past BP meds: lisinopril- not been consistent with medication Duration of hyperlipidemia: years Cholesterol medication side effects: no Cholesterol supplements: none Past cholesterol medications: atorvastain (lipitor) Medication compliance: excellent compliance  Aspirin: no Recent stressors: no  Recurrent headaches: no  Visual changes: no Palpitations: no Dyspnea: no Chest pain: no Lower extremity edema: no  Dizzy/lightheaded: no  DIABETES Hypoglycemic episodes: few times a month Polydipsia/polyuria: no Visual disturbance: no Chest pain: no Paresthesias: no Glucose Monitoring: no, but just received a new glucometer from work  Accucheck frequency: Not Checking  Fasting glucose:  Post prandial:  Evening:  Before meals: Taking Insulin?: no  Long acting insulin:  Short acting insulin: Blood Pressure Monitoring: not checking Retinal Examination: Up to date; working to obtain records Foot Exam: Completed this visit Diabetic Education: Completed Pneumovax: N/A Influenza: Completed this visit Aspirin: no   [facility] in [place]- for eye exam   Relevant past medical, surgical, family and social history reviewed and updated as  indicated. Interim medical history since our last visit reviewed. Allergies and medications reviewed and updated.  Review of Systems  Constitutional:  Negative for activity change, appetite change, chills and fatigue.  Eyes:  Negative for visual disturbance.  Respiratory:  Negative for cough, chest tightness and shortness of breath.   Cardiovascular:  Negative for chest pain, palpitations and leg swelling.  Gastrointestinal:  Negative for constipation, diarrhea, nausea and vomiting.  Endocrine: Negative for polydipsia and polyuria.  Genitourinary:  Negative for difficulty urinating and dysuria.  Neurological:  Negative for dizziness, weakness, light-headedness, numbness and headaches.    Per HPI unless specifically indicated above     Objective:    BP 130/89   Pulse 86   Temp 98.4 F (36.9 C) (Oral)   Wt 108 kg   SpO2 98%   BMI 46.48 kg/m   Wt Readings from Last 3 Encounters:  11/06/21 108 kg  08/05/21 110.4 kg  05/03/21 107.5 kg    Physical Exam Vitals and nursing note reviewed.  Constitutional:      General: She is not in acute distress.    Appearance: Normal appearance. She is obese. She is not ill-appearing, toxic-appearing or diaphoretic.  HENT:     Head: Normocephalic.  Cardiovascular:     Rate and Rhythm: Normal rate and regular rhythm.     Heart sounds: No murmur heard. Pulmonary:     Effort: Pulmonary effort is normal. No respiratory distress.     Breath sounds: Normal breath sounds. No wheezing or rales.  Skin:    General: Skin is warm and dry.     Capillary Refill:  Capillary refill takes less than 2 seconds.  Neurological:     General: No focal deficit present.     Mental Status: She is alert and oriented to person, place, and time. Mental status is at baseline.  Psychiatric:        Mood and Affect: Mood normal.        Behavior: Behavior normal.        Thought Content: Thought content normal.        Judgment: Judgment normal.     Results for  orders placed or performed in visit on 08/05/21  Comp Met (CMET)  Result Value Ref Range   Glucose 318 (H) 70 - 99 mg/dL   BUN 10 6 - 24 mg/dL   Creatinine, Ser 0.84 0.57 - 1.00 mg/dL   eGFR 87 >59 mL/min/1.73   BUN/Creatinine Ratio 12 9 - 23   Sodium 136 134 - 144 mmol/L   Potassium 4.2 3.5 - 5.2 mmol/L   Chloride 100 96 - 106 mmol/L   CO2 21 20 - 29 mmol/L   Calcium 9.3 8.7 - 10.2 mg/dL   Total Protein 6.4 6.0 - 8.5 g/dL   Albumin 4.0 3.8 - 4.8 g/dL   Globulin, Total 2.4 1.5 - 4.5 g/dL   Albumin/Globulin Ratio 1.7 1.2 - 2.2   Bilirubin Total 0.3 0.0 - 1.2 mg/dL   Alkaline Phosphatase 129 (H) 44 - 121 IU/L   AST 12 0 - 40 IU/L   ALT 25 0 - 32 IU/L  HgB A1c  Result Value Ref Range   Hgb A1c MFr Bld 8.2 (H) 4.8 - 5.6 %   Est. average glucose Bld gHb Est-mCnc 189 mg/dL      Assessment & Plan:   Problem List Items Addressed This Visit       Cardiovascular and Mediastinum   Essential hypertension--Lisinopril    BP controlled.  No changes to current regimen at present time. Obtaining BMP and microalbumin to assess kidney function.  Pt encouraged to continue eating healthier diet and increasing physical activity.      Relevant Orders   Comp Met (CMET)     Endocrine   Type 2 diabetes mellitus ([place]) - Primary    A1C drawn today in clinic. Discussed with pt anticipating increasing dosage of trulicity to 33m from 11.6XW  Will notify pt of results on microalbumin, A1C and results from [facility]       Relevant Orders   HgB A1c   Microalbumin, Urine Waived     Other   Morbid obesity ([place] 260 lb    Pt congratulated on 5lb weight loss since last visit in June.  Pt reports cutting out red meat and eating more fruits and vegetables.  No changes to current regimen.        Follow up plan: Return in about 3 months (around 02/05/2022) for BP, DM.

## 2021-11-06 NOTE — Assessment & Plan Note (Signed)
Pt congratulated on 5lb weight loss since last visit in June.  Pt reports cutting out red meat and eating more fruits and vegetables.  No changes to current regimen.

## 2021-11-06 NOTE — Assessment & Plan Note (Signed)
A1C drawn today in clinic. Discussed with pt anticipating increasing dosage of trulicity to '3mg'$  from 1.'5mg'$ .  Will notify pt of results on microalbumin, A1C and results from [place].

## 2021-11-07 LAB — HEMOGLOBIN A1C
Est. average glucose Bld gHb Est-mCnc: 183 mg/dL
Hgb A1c MFr Bld: 8 % — ABNORMAL HIGH (ref 4.8–5.6)

## 2021-11-07 LAB — COMPREHENSIVE METABOLIC PANEL
ALT: 25 IU/L (ref 0–32)
AST: 18 IU/L (ref 0–40)
Albumin/Globulin Ratio: 1.6 (ref 1.2–2.2)
Albumin: 4.4 g/dL (ref 3.9–4.9)
Alkaline Phosphatase: 128 IU/L — ABNORMAL HIGH (ref 44–121)
BUN/Creatinine Ratio: 12 (ref 9–23)
BUN: 10 mg/dL (ref 6–24)
Bilirubin Total: 0.3 mg/dL (ref 0.0–1.2)
CO2: 18 mmol/L — ABNORMAL LOW (ref 20–29)
Calcium: 9.7 mg/dL (ref 8.7–10.2)
Chloride: 101 mmol/L (ref 96–106)
Creatinine, Ser: 0.83 mg/dL (ref 0.57–1.00)
Globulin, Total: 2.7 g/dL (ref 1.5–4.5)
Glucose: 228 mg/dL — ABNORMAL HIGH (ref 70–99)
Potassium: 4.3 mmol/L (ref 3.5–5.2)
Sodium: 137 mmol/L (ref 134–144)
Total Protein: 7.1 g/dL (ref 6.0–8.5)
eGFR: 88 mL/min/{1.73_m2} (ref >59)

## 2021-11-07 MED ORDER — TRULICITY 3 MG/0.5ML ~~LOC~~ SOAJ: 3.0000 mg | SUBCUTANEOUS | 1 refills | Status: DC

## 2021-11-07 NOTE — Progress Notes (Signed)
Trulicity '3mg'$  sent to the pharmacy.

## 2021-11-07 NOTE — Addendum Note (Signed)
Addended by: Jon Billings on: 11/07/2021 11:42 AM   Modules accepted: Orders

## 2021-11-07 NOTE — Progress Notes (Signed)
Please let patient know that her A1c improved from 8.2 to 8.0.  I would like to still increase her dose of Trulicity to '3mg'$ .  If she is okay with this I will send it into the pharmacy. No other concerns at this time.  Follow up as discussed.

## 2021-12-09 ENCOUNTER — Other Ambulatory Visit: Payer: Self-pay | Admitting: Nurse Practitioner

## 2021-12-09 DIAGNOSIS — E1169 Type 2 diabetes mellitus with other specified complication: Secondary | ICD-10-CM

## 2021-12-09 NOTE — Telephone Encounter (Signed)
Carmen [name] with [facility] requests 90 day prescriptions with 3 refills  Medication Refill - Medication: atorvastatin (LIPITOR) 20 MG tablet and dapagliflozin propanediol (FARXIGA) 10 MG TABS tablet  Has the patient contacted their pharmacy? Yes.    Preferred Pharmacy (with phone number or street name):  [facility], [place] Phone:  [phone]  Fax:  [phone]     Has the patient been seen for an appointment in the last year OR does the patient have an upcoming appointment? Yes.    Agent: Please be advised that RX refills may take up to 3 business days. We ask that you follow-up with your pharmacy.

## 2021-12-10 NOTE — Telephone Encounter (Signed)
Requested Prescriptions  Pending Prescriptions Disp Refills  . dapagliflozin propanediol (FARXIGA) 10 MG TABS tablet 90 tablet 1    Sig: Take 1 tablet (10 mg total) by mouth daily before breakfast.     Endocrinology:  Diabetes - SGLT2 Inhibitors Failed - 12/09/2021  1:39 PM      Failed - HBA1C is between 0 and 7.9 and within 180 days    Hgb A1c MFr Bld  Date Value Ref Range Status  11/06/2021 8.0 (H) 4.8 - 5.6 % Final    Comment:             Prediabetes: 5.7 - 6.4          Diabetes: >6.4          Glycemic control for adults with diabetes: <7.0          Passed - Cr in normal range and within 360 days    Creatinine, Ser  Date Value Ref Range Status  11/06/2021 0.83 0.57 - 1.00 mg/dL Final         Passed - eGFR in normal range and within 360 days    GFR calc Af Amer  Date Value Ref Range Status  02/10/2017 >60 >60 mL/min Final    Comment:    (NOTE) The eGFR has been calculated using the CKD EPI equation. This calculation has not been validated in all clinical situations. eGFR's persistently <60 mL/min signify possible Chronic Kidney Disease.    GFR calc non Af Amer  Date Value Ref Range Status  02/10/2017 >60 >60 mL/min Final   eGFR  Date Value Ref Range Status  11/06/2021 88 >59 mL/min/1.73 Final         Passed - Valid encounter within last 6 months    Recent Outpatient Visits          1 month ago Type 2 diabetes mellitus with other specified complication, without long-term current use of insulin ([place])   [facility] Jon Billings, NP   4 months ago Essential hypertension--Lisinopril   [facility] Jon Billings, NP   7 months ago Essential hypertension--Lisinopril   [facility] Jon Billings, NP   10 months ago Annual physical exam   [facility] Jon Billings, NP   1 year ago Type 2 diabetes mellitus with other specified complication, without long-term current use of insulin ([place])   [facility] Jon Billings, NP      Future Appointments            In 1 month Jon Billings, NP [facility]           . atorvastatin (LIPITOR) 20 MG tablet 90 tablet 1    Sig: TAKE 1 TABLET BY MOUTH EVERY DAY IN THE EVENING     Cardiovascular:  Antilipid - Statins Failed - 12/09/2021  1:39 PM      Failed - Lipid Panel in normal range within the last 12 months    Cholesterol, Total  Date Value Ref Range Status  05/03/2021 123 100 - 199 mg/dL Final   LDL Chol Calc (NIH)  Date Value Ref Range Status  05/03/2021 30 0 - 99 mg/dL Final   HDL  Date Value Ref Range Status  05/03/2021 72 >39 mg/dL Final   Triglycerides  Date Value Ref Range Status  05/03/2021 123 0 - 149 mg/dL Final         Passed - Patient is not pregnant      Passed -  Valid encounter within last 12 months    Recent Outpatient Visits          1 month ago Type 2 diabetes mellitus with other specified complication, without long-term current use of insulin ([place])   [facility] Jon Billings, NP   4 months ago Essential hypertension--Lisinopril   [facility] Jon Billings, NP   7 months ago Essential hypertension--Lisinopril   [facility] Jon Billings, NP   10 months ago Annual physical exam   [facility] Jon Billings, NP   1 year ago Type 2 diabetes mellitus with other specified complication, without long-term current use of insulin ([place])   [facility] Jon Billings, NP      Future Appointments            In 1 month Jon Billings, NP [facility], [place]

## 2022-02-05 ENCOUNTER — Ambulatory Visit: Payer: 59 | Admitting: Nurse Practitioner

## 2022-02-19 ENCOUNTER — Ambulatory Visit: Payer: 59 | Admitting: Nurse Practitioner

## 2022-02-19 ENCOUNTER — Encounter: Payer: Self-pay | Admitting: Nurse Practitioner

## 2022-02-19 VITALS — BP 111/76 | HR 66 | Temp 98.0°F | Wt 243.2 lb

## 2022-02-19 DIAGNOSIS — E1169 Type 2 diabetes mellitus with other specified complication: Secondary | ICD-10-CM | POA: Diagnosis not present

## 2022-02-19 DIAGNOSIS — I10 Essential (primary) hypertension: Secondary | ICD-10-CM

## 2022-02-19 LAB — MICROALBUMIN, URINE WAIVED
Creatinine, Urine Waived: 50 mg/dL (ref 10–300)
Microalb, Ur Waived: 10 mg/L (ref 0–19)

## 2022-02-19 MED ORDER — TRULICITY 3 MG/0.5ML ~~LOC~~ SOAJ: 3.0000 mg | SUBCUTANEOUS | 1 refills | Status: DC

## 2022-02-19 MED ORDER — ATORVASTATIN CALCIUM 20 MG PO TABS: ORAL_TABLET | ORAL | 1 refills | Status: DC

## 2022-02-19 MED ORDER — DAPAGLIFLOZIN PROPANEDIOL 10 MG PO TABS: 10.0000 mg | ORAL_TABLET | Freq: Every day | ORAL | 1 refills | Status: DC

## 2022-02-19 MED ORDER — LISINOPRIL 5 MG PO TABS: ORAL_TABLET | ORAL | 1 refills | Status: DC

## 2022-02-19 NOTE — Assessment & Plan Note (Signed)
Recommended eating smaller high protein, low fat meals more frequently and exercising 30 mins a day 5 times a week with a goal of 10-15lb weight loss in the next 3 months.  

## 2022-02-19 NOTE — Assessment & Plan Note (Signed)
Chronic. Last A1c was 8.0.  Has only been taking Trulicity 1.'5mg'$  weekly.  Did not pick up the '3mg'$ .  Resent prescription for '3mg'$  for patient to increase dose.  Labs ordered today.  Follow up in 3 months.  Call sooner if concerns arise.

## 2022-02-19 NOTE — Assessment & Plan Note (Signed)
Chronic.  Controlled.  Continue with current medication regimen of Lisinopril '5mg'$ .  Refills sent today.  Labs ordered today.  Return to clinic in 3 months for reevaluation.  Call sooner if concerns arise.

## 2022-02-19 NOTE — Progress Notes (Signed)
BP 111/76   Pulse 66   Temp 98 F (36.7 C) (Oral)   Wt 243 lb 3.2 oz (110.3 kg)   LMP 02/04/2022 (Approximate)   SpO2 100%   BMI 47.50 kg/m    Subjective:    Patient ID: Carmen [name], female    DOB: 06-Nov-1975, 47 y.o.   MRN: [id number]  HPI: Carmen [name] is a 47 y.o. female   Chief Complaint  Patient presents with   Diabetes    No recent eye exam per patient   Hyperlipidemia   Hypertension   HYPERTENSION / [place] Satisfied with current treatment? yes Duration of hypertension: years BP monitoring frequency: not checking BP range:  BP medication side effects: no  Past BP meds: lisinopril- not been consistent with medication Duration of hyperlipidemia: years Cholesterol medication side effects: no Cholesterol supplements: none Past cholesterol medications: atorvastain (lipitor) Medication compliance: excellent compliance  Aspirin: no Recent stressors: no  Recurrent headaches: no  Visual changes: no Palpitations: no Dyspnea: no Chest pain: no Lower extremity edema: no  Dizzy/lightheaded: no  DIABETES Has been consistent with Trulicity and [place]. Patient is only taking 1.'5mg'$  of Trulicity.   Hypoglycemic episodes: no Polydipsia/polyuria: no Visual disturbance: no Chest pain: no Paresthesias: no Glucose Monitoring: no  Accucheck frequency: Not Checking  Fasting glucose:  Post prandial:  Evening:  Before meals: Taking Insulin?: no  Long acting insulin:  Short acting insulin: Blood Pressure Monitoring: not checking Retinal Examination: Up to date; working to obtain records Foot Exam: up to date Diabetic Education: Completed Pneumovax: up to date Influenza: up to date Aspirin: no   [facility] in [place]- for eye exam   Relevant past medical, surgical, family and social history reviewed and updated as indicated. Interim medical history since our last visit reviewed. Allergies and medications reviewed and  updated.  Review of Systems  Constitutional:  Negative for activity change, appetite change, chills and fatigue.  Eyes:  Negative for visual disturbance.  Respiratory:  Negative for cough, chest tightness and shortness of breath.   Cardiovascular:  Negative for chest pain, palpitations and leg swelling.  Gastrointestinal:  Negative for constipation, diarrhea, nausea and vomiting.  Endocrine: Negative for polydipsia and polyuria.  Genitourinary:  Negative for difficulty urinating and dysuria.  Neurological:  Negative for dizziness, weakness, light-headedness, numbness and headaches.    Per HPI unless specifically indicated above     Objective:    BP 111/76   Pulse 66   Temp 98 F (36.7 C) (Oral)   Wt 243 lb 3.2 oz (110.3 kg)   LMP 02/04/2022 (Approximate)   SpO2 100%   BMI 47.50 kg/m   Wt Readings from Last 3 Encounters:  02/19/22 243 lb 3.2 oz (110.3 kg)  11/06/21 238 lb (108 kg)  08/05/21 243 lb 6.4 oz (110.4 kg)    Physical Exam Vitals and nursing note reviewed.  Constitutional:      General: She is not in acute distress.    Appearance: Normal appearance. She is obese. She is not ill-appearing, toxic-appearing or diaphoretic.  HENT:     Head: Normocephalic.  Cardiovascular:     Rate and Rhythm: Normal rate and regular rhythm.     Heart sounds: No murmur heard. Pulmonary:     Effort: Pulmonary effort is normal. No respiratory distress.     Breath sounds: Normal breath sounds. No wheezing or rales.  Skin:    General: Skin is warm and dry.     Capillary Refill: Capillary refill  takes less than 2 seconds.  Neurological:     General: No focal deficit present.     Mental Status: She is alert and oriented to person, place, and time. Mental status is at baseline.  Psychiatric:        Mood and Affect: Mood normal.        Behavior: Behavior normal.        Thought Content: Thought content normal.        Judgment: Judgment normal.     Results for orders placed or  performed in visit on 11/06/21  Comp Met (CMET)  Result Value Ref Range   Glucose 228 (H) 70 - 99 mg/dL   BUN 10 6 - 24 mg/dL   Creatinine, Ser 0.83 0.57 - 1.00 mg/dL   eGFR 88 >59 mL/min/1.73   BUN/Creatinine Ratio 12 9 - 23   Sodium 137 134 - 144 mmol/L   Potassium 4.3 3.5 - 5.2 mmol/L   Chloride 101 96 - 106 mmol/L   CO2 18 (L) 20 - 29 mmol/L   Calcium 9.7 8.7 - 10.2 mg/dL   Total Protein 7.1 6.0 - 8.5 g/dL   Albumin 4.4 3.9 - 4.9 g/dL   Globulin, Total 2.7 1.5 - 4.5 g/dL   Albumin/Globulin Ratio 1.6 1.2 - 2.2   Bilirubin Total 0.3 0.0 - 1.2 mg/dL   Alkaline Phosphatase 128 (H) 44 - 121 IU/L   AST 18 0 - 40 IU/L   ALT 25 0 - 32 IU/L  HgB A1c  Result Value Ref Range   Hgb A1c MFr Bld 8.0 (H) 4.8 - 5.6 %   Est. average glucose Bld gHb Est-mCnc 183 mg/dL      Assessment & Plan:   Problem List Items Addressed This Visit       Cardiovascular and Mediastinum   Essential hypertension--Lisinopril    Chronic.  Controlled.  Continue with current medication regimen of Lisinopril '5mg'$ .  Refills sent today.  Labs ordered today.  Return to clinic in 3 months for reevaluation.  Call sooner if concerns arise.        Relevant Medications   lisinopril (ZESTRIL) 5 MG tablet   atorvastatin (LIPITOR) 20 MG tablet   Other Relevant Orders   Comp Met (CMET)     Endocrine   Type 2 diabetes mellitus ([place]) - Primary    Chronic. Last A1c was 8.0.  Has only been taking Trulicity 1.'5mg'$  weekly.  Did not pick up the '3mg'$ .  Resent prescription for '3mg'$  for patient to increase dose.  Labs ordered today.  Follow up in 3 months.  Call sooner if concerns arise.       Relevant Medications   lisinopril (ZESTRIL) 5 MG tablet   atorvastatin (LIPITOR) 20 MG tablet   dapagliflozin propanediol (FARXIGA) 10 MG TABS tablet   Dulaglutide (TRULICITY) 3 MW/1.0UV SOPN   Other Relevant Orders   HgB A1c   Microalbumin, Urine Waived     Other   Morbid obesity (HCC) 260 lb    Recommended eating smaller high  protein, low fat meals more frequently and exercising 30 mins a day 5 times a week with a goal of 10-15lb weight loss in the next 3 months.       Relevant Medications   dapagliflozin propanediol (FARXIGA) 10 MG TABS tablet   Dulaglutide (TRULICITY) 3 OZ/3.6UY SOPN     Follow up plan: Return in about 3 months (around 05/21/2022) for Physical and Fasting labs.

## 2022-02-20 LAB — COMPREHENSIVE METABOLIC PANEL
ALT: 29 IU/L (ref 0–32)
AST: 17 IU/L (ref 0–40)
Albumin/Globulin Ratio: 1.6 (ref 1.2–2.2)
Albumin: 4.7 g/dL (ref 3.9–4.9)
Alkaline Phosphatase: 140 IU/L — ABNORMAL HIGH (ref 44–121)
BUN/Creatinine Ratio: 17 (ref 9–23)
BUN: 15 mg/dL (ref 6–24)
Bilirubin Total: 0.2 mg/dL (ref 0.0–1.2)
CO2: 24 mmol/L (ref 20–29)
Calcium: 9.6 mg/dL (ref 8.7–10.2)
Chloride: 100 mmol/L (ref 96–106)
Creatinine, Ser: 0.89 mg/dL (ref 0.57–1.00)
Globulin, Total: 2.9 g/dL (ref 1.5–4.5)
Glucose: 201 mg/dL — ABNORMAL HIGH (ref 70–99)
Potassium: 4 mmol/L (ref 3.5–5.2)
Sodium: 140 mmol/L (ref 134–144)
Total Protein: 7.6 g/dL (ref 6.0–8.5)
eGFR: 81 mL/min/{1.73_m2} (ref >59)

## 2022-02-20 LAB — HEMOGLOBIN A1C
Est. average glucose Bld gHb Est-mCnc: 189 mg/dL
Hgb A1c MFr Bld: 8.2 % — ABNORMAL HIGH (ref 4.8–5.6)

## 2022-02-20 NOTE — Progress Notes (Signed)
Hi Carmen [name]. It was good to see you yesterday.  Your lab work shows that your A1c increased to 8.2.  I recommend being consistent with your medication as well as increasing the Trulicity to '3mg'$  weekly.  Please let me know if you have any questions.

## 2022-02-20 NOTE — Progress Notes (Signed)
It is due to her A1c not being well controlled.  Following a low carb diet and getting her A1c under control will help with the microalbumin.

## 2022-03-20 ENCOUNTER — Encounter: Payer: Self-pay | Admitting: Nurse Practitioner

## 2022-04-11 ENCOUNTER — Ambulatory Visit: Payer: Self-pay

## 2022-04-11 NOTE — Telephone Encounter (Signed)
Contacted patient by telephone regarding her scheduled UC appointment today at 5:30 pm for "Sore Throat" in the context of "I've sick since last Sunday with food poisoning and I eaten hardly anything for last few days diarrhea throwing up I got to hot one night and fainted now I have a sore throat and it hurts to swallow - Entered by patient".  Given hx of DM2 recently not well controlled, currently reported episodes of emesis and diarrhea 5+ days, I am concerned for possibility of dehydration and HSS.  Asked patient to go to the ED for evaluation and treatment. She acknowledged and agreed with this recommendation.

## 2022-04-12 ENCOUNTER — Emergency Department
Admission: EM | Admit: 2022-04-12 | Discharge: 2022-04-12 | Disposition: A | Discharge status: Home / Self Care | Payer: 59 | Attending: Emergency Medicine | Admitting: Emergency Medicine

## 2022-04-12 ENCOUNTER — Other Ambulatory Visit: Payer: Self-pay

## 2022-04-12 ENCOUNTER — Encounter: Payer: Self-pay | Admitting: Intensive Care

## 2022-04-12 DIAGNOSIS — I10 Essential (primary) hypertension: Secondary | ICD-10-CM | POA: Diagnosis not present

## 2022-04-12 DIAGNOSIS — E119 Type 2 diabetes mellitus without complications: Secondary | ICD-10-CM | POA: Insufficient documentation

## 2022-04-12 DIAGNOSIS — U071 COVID-19: Secondary | ICD-10-CM | POA: Insufficient documentation

## 2022-04-12 DIAGNOSIS — R1110 Vomiting, unspecified: Secondary | ICD-10-CM | POA: Diagnosis present

## 2022-04-12 LAB — RESP PANEL BY RT-PCR (RSV, FLU A&B, COVID)  RVPGX2
Influenza A by PCR: NEGATIVE
Influenza B by PCR: NEGATIVE
Resp Syncytial Virus by PCR: NEGATIVE
SARS Coronavirus 2 by RT PCR: POSITIVE — AB

## 2022-04-12 LAB — CBG MONITORING, ED: Glucose-Capillary: 213 mg/dL — ABNORMAL HIGH (ref 70–99)

## 2022-04-12 MED ORDER — ONDANSETRON 4 MG PO TBDP: 4.0000 mg | ORAL_TABLET | Freq: Three times a day (TID) | ORAL | 0 refills | Status: DC | PRN

## 2022-04-12 NOTE — ED Notes (Signed)
Patient CBG was 213 at 12:41pm.

## 2022-04-12 NOTE — ED Triage Notes (Signed)
Patient c/o diarrhea and emesis for a few days. Reports chills but unknown if having fever. Also reports cough and sore throat.

## 2022-04-12 NOTE — ED Notes (Signed)
Patient declined discharge vital signs. 

## 2022-04-12 NOTE — ED Provider Notes (Signed)
[facility] Provider Note    Event Date/Time   First MD Initiated Contact with Patient 04/12/22 1215     (approximate)   History   Diarrhea and Emesis   HPI  Carmen [name] is a 47 y.o. female with non-insulin-dependent diabetes, hypertension, high cholesterol presents emergency department with concerns of diarrhea and emesis for few days which is turned into nausea and constipation.  Patient states she also reported chills, fever, cough and congestion.  Lost her sense of taste.      Physical Exam   Triage Vital Signs: ED Triage Vitals  Enc Vitals Group     BP 04/12/22 1130 (!) 136/90     Pulse Rate 04/12/22 1130 (!) 102     Resp 04/12/22 1130 18     Temp 04/12/22 1130 98.5 F (36.9 C)     Temp Source 04/12/22 1130 Oral     SpO2 04/12/22 1130 92 %     Weight 04/12/22 1131 240 lb (108.9 kg)     Height 04/12/22 1131 '4\' 11"'$  (1.499 m)     Head Circumference --      Peak Flow --      Pain Score 04/12/22 1131 0     Pain Loc --      Pain Edu? --      Excl. in [place]? --     Most recent vital signs: Vitals:   04/12/22 1130  BP: (!) 136/90  Pulse: (!) 102  Resp: 18  Temp: 98.5 F (36.9 C)  SpO2: 92%     General: Awake, no distress.   CV:  Good peripheral perfusion. regular rate and  rhythm Resp:  Normal effort. Lungs cta Abd:  No distention.  Nontender Other:      ED Results / Procedures / Treatments   Labs (all labs ordered are listed, but only abnormal results are displayed) Labs Reviewed  RESP PANEL BY RT-PCR (RSV, FLU A&B, COVID)  [id number] - Abnormal; Notable for the following components:      Result Value   SARS Coronavirus 2 by RT PCR POSITIVE (*)    All other components within normal limits  CBG MONITORING, ED     EKG     RADIOLOGY     PROCEDURES:   Procedures   MEDICATIONS ORDERED IN ED: Medications - No data to display   IMPRESSION / MDM / [place] / ED COURSE  I reviewed the triage  vital signs and the nursing notes.                              Differential diagnosis includes, but is not limited to, COVID, influenza, RSV, constipation, vomiting, elevated glucose  Patient's presentation is most consistent with acute complicated illness / injury requiring diagnostic workup.   COVID test positive, will do CBG to assess patient's glucose levels  Cbg is 215, pt instructed to drink fluids, take medications as prescribed.  Return if worsening      FINAL CLINICAL IMPRESSION(S) / ED DIAGNOSES   Final diagnoses:  COVID     Rx / DC Orders   ED Discharge Orders          Ordered    ondansetron (ZOFRAN-ODT) 4 MG disintegrating tablet  Every 8 hours PRN        04/12/22 1242             Note:  This document was prepared  using Systems analyst and may include unintentional dictation errors.    Versie Starks, PA-C 04/12/22 1243    Naaman Plummer, MD 04/12/22 [phone]

## 2022-04-15 ENCOUNTER — Telehealth: Payer: Self-pay

## 2022-04-15 NOTE — Transitions of Care (Post Inpatient/ED Visit) (Unsigned)
   04/15/2022  Name: Carmen [name] MRN: [id number] DOB: 1975-09-22  Today's TOC FU Call Status: Today's TOC FU Call Status:: Unsuccessul Call (1st Attempt) Unsuccessful Call (1st Attempt) Date: 04/15/22  Attempted to reach the patient regarding the most recent Inpatient/ED visit.  Follow Up Plan: Additional outreach attempts will be made to reach the patient to complete the Transitions of Care (Post Inpatient/ED visit) call.   Signature: Yvonna Alanis, [place]

## 2022-04-15 NOTE — Progress Notes (Unsigned)
   There were no vitals taken for this visit.   Subjective:    Patient ID: Carmen [name], female    DOB: 1975-09-14, 47 y.o.   MRN: [id number]  HPI: Carmen [name] is a 47 y.o. female  No chief complaint on file.   Relevant past medical, surgical, family and social history reviewed and updated as indicated. Interim medical history since our last visit reviewed. Allergies and medications reviewed and updated.  Review of Systems  Per HPI unless specifically indicated above     Objective:    There were no vitals taken for this visit.  Wt Readings from Last 3 Encounters:  04/12/22 240 lb (108.9 kg)  02/19/22 243 lb 3.2 oz (110.3 kg)  11/06/21 238 lb (108 kg)    Physical Exam  Results for orders placed or performed during the hospital encounter of 04/12/22  Resp panel by RT-PCR (RSV, Flu A&B, Covid) Anterior Nasal Swab   Specimen: Anterior Nasal Swab  Result Value Ref Range   SARS Coronavirus 2 by RT PCR POSITIVE (A) NEGATIVE   Influenza A by PCR NEGATIVE NEGATIVE   Influenza B by PCR NEGATIVE NEGATIVE   Resp Syncytial Virus by PCR NEGATIVE NEGATIVE  CBG monitoring, ED  Result Value Ref Range   Glucose-Capillary 213 (H) 70 - 99 mg/dL      Assessment & Plan:   Problem List Items Addressed This Visit   None    Follow up plan: No follow-ups on file.

## 2022-04-16 ENCOUNTER — Encounter: Payer: Self-pay | Admitting: Nurse Practitioner

## 2022-04-16 ENCOUNTER — Ambulatory Visit: Payer: 59 | Admitting: Nurse Practitioner

## 2022-04-16 VITALS — BP 129/78 | HR 102 | Temp 98.1°F | Wt 238.4 lb

## 2022-04-16 DIAGNOSIS — U071 COVID-19: Secondary | ICD-10-CM

## 2022-04-16 NOTE — Telephone Encounter (Signed)
Patient was seen in office today.  

## 2022-05-21 ENCOUNTER — Ambulatory Visit (INDEPENDENT_AMBULATORY_CARE_PROVIDER_SITE_OTHER): Payer: 59 | Admitting: Nurse Practitioner

## 2022-05-21 ENCOUNTER — Encounter: Payer: Self-pay | Admitting: Nurse Practitioner

## 2022-05-21 VITALS — BP 110/80 | HR 78 | Temp 98.1°F | Ht 59.02 in | Wt 239.6 lb

## 2022-05-21 DIAGNOSIS — I10 Essential (primary) hypertension: Secondary | ICD-10-CM | POA: Diagnosis not present

## 2022-05-21 DIAGNOSIS — Z0000 Encounter for general adult medical examination without abnormal findings: Secondary | ICD-10-CM

## 2022-05-21 DIAGNOSIS — E118 Type 2 diabetes mellitus with unspecified complications: Secondary | ICD-10-CM | POA: Diagnosis not present

## 2022-05-21 DIAGNOSIS — Z136 Encounter for screening for cardiovascular disorders: Secondary | ICD-10-CM

## 2022-05-21 DIAGNOSIS — Z1231 Encounter for screening mammogram for malignant neoplasm of breast: Secondary | ICD-10-CM

## 2022-05-21 LAB — URINALYSIS, ROUTINE W REFLEX MICROSCOPIC
Bilirubin, UA: NEGATIVE
Leukocytes,UA: NEGATIVE
Nitrite, UA: NEGATIVE
Protein,UA: NEGATIVE
RBC, UA: NEGATIVE
Specific Gravity, UA: 1.02 (ref 1.005–1.030)
Urobilinogen, Ur: 0.2 mg/dL (ref 0.2–1.0)
pH, UA: 5 (ref 5.0–7.5)

## 2022-05-21 NOTE — Assessment & Plan Note (Signed)
Recommended eating smaller high protein, low fat meals more frequently and exercising 30 mins a day 5 times a week with a goal of 10-15lb weight loss in the next 3 months.  

## 2022-05-21 NOTE — Assessment & Plan Note (Signed)
Chronic.  Controlled.  Continue with current medication regimen of Lisinopril 5mg  daily.  Labs ordered today.  Return to clinic in 3 months for reevaluation.  Call sooner if concerns arise.

## 2022-05-21 NOTE — Patient Instructions (Signed)
Please call to schedule your mammogram and/or bone density: [facility]  Address: [address], [place], [place] [address] Phone: [phone]  [facility] [place] [address] [place],  [place]  [address] Phone: [phone]   

## 2022-05-21 NOTE — Assessment & Plan Note (Signed)
Chronic. Last A1c was 8.2 in January.  Tolerating Trulicity 3mg  weekly.  Will likely need to increase dose to 4.5mg  weekly.  Continue with [place].  Labs ordered today.  Follow up in 3 months.  Call sooner if concerns arise.

## 2022-05-21 NOTE — Progress Notes (Signed)
BP 110/80   Pulse 78   Temp 98.1 F (36.7 C) (Oral)   Ht 4' 11.02" (1.499 m)   Wt 239 lb 9.6 oz (108.7 kg)   SpO2 100%   BMI 48.37 kg/m    Subjective:    Patient ID: Carmen [name], female    DOB: [birth year], 47 y.o.   MRN: [id number]  HPI: Carmen [name] is a 47 y.o. female presenting on 05/21/2022 for comprehensive medical examination. Current medical complaints include:none  She currently lives with: Menopausal Symptoms: no  HYPERTENSION without Chronic Kidney Disease Hypertension status: controlled  Satisfied with current treatment? yes Duration of hypertension: years BP monitoring frequency:  not checking BP range:  BP medication side effects:  no Medication compliance: excellent compliance Previous BP meds:lisinopril Aspirin: no Recurrent headaches: no Visual changes: no Palpitations: no Dyspnea: no Chest pain: no Lower extremity edema: no Dizzy/lightheaded: no  DIABETES Working on cutting back on her soda intake.  She is down to 1 per day.  Hypoglycemic episodes:no Polydipsia/polyuria: no Visual disturbance: no Chest pain: no Paresthesias: no Glucose Monitoring: not checking  Accucheck frequency: not checking  Fasting glucose:  Post prandial:  Evening:  Before meals: Taking Insulin?: no  Long acting insulin:  Short acting insulin: Blood Pressure Monitoring: not checking Retinal Examination: Not up to Date- needs an appt Foot Exam: Up to Date Diabetic Education: Not Completed Pneumovax: Up to Date Influenza:  up to date Aspirin: no   Depression Screen done today and results listed below:     05/21/2022   10:06 AM 02/19/2022    1:30 PM 11/06/2021    2:34 PM 08/05/2021    2:03 PM 05/03/2021    3:24 PM  Depression screen PHQ 2/9  Decreased Interest 0 1 2 1  0  Down, Depressed, Hopeless 0 1 2 1  0  PHQ - 2 Score 0 2 4 2  0  Altered sleeping 0 1 2 2 1   Tired, decreased energy 0 1 2 1 1   Change in appetite 2 1 2 2 2   Feeling bad or  failure about yourself  0 0 1 1 0  Trouble concentrating 0 0 0 0 0  Moving slowly or fidgety/restless 0 0 0 0 0  Suicidal thoughts 0 0 0 0 0  PHQ-9 Score 2 5 11 8 4   Difficult doing work/chores Not difficult at all Not difficult at all Somewhat difficult Somewhat difficult Somewhat difficult    The patient does not have a history of falls. I did complete a risk assessment for falls. A plan of care for falls was documented.   Past Medical History:  Past Medical History:  Diagnosis Date   Anxiety    NO MEDS   Depression    NO MEDS   Diabetes mellitus without complication    High cholesterol    Hypertension    Morbid obesity     Surgical History:  Past Surgical History:  Procedure Laterality Date   CHOLECYSTECTOMY     COLONOSCOPY WITH PROPOFOL     COLONOSCOPY WITH PROPOFOL N/A 11/17/2017   Procedure: COLONOSCOPY WITH PROPOFOL;  Surgeon: Wyline Mood, MD;  Location[facility] ENDOSCOPY;  Service: Gastroenterology;  Laterality: N/A;   ESOPHAGOGASTRODUODENOSCOPY (EGD) WITH PROPOFOL N/A 02/17/2017   Procedure: ESOPHAGOGASTRODUODENOSCOPY (EGD) WITH PROPOFOL;  Surgeon: Wyline Mood, MD;  Location: [facility] ENDOSCOPY;  Service: Gastroenterology;  Laterality: N/A;   ROBOTIC ASSISTED LAPAROSCOPIC CHOLECYSTECTOMY N/A 04/23/2017   Procedure: ROBOTIC ASSISTED LAPAROSCOPIC CHOLECYSTECTOMY;  Surgeon: Leafy Ro, MD;  Location: [facility] ORS;  Service: General;  Laterality: N/A;    Medications:  Current Outpatient Medications on File Prior to Visit  Medication Sig   atorvastatin (LIPITOR) 20 MG tablet TAKE 1 TABLET BY MOUTH EVERY DAY IN THE EVENING   dapagliflozin propanediol (FARXIGA) 10 MG TABS tablet Take 1 tablet (10 mg total) by mouth daily before breakfast.   Dulaglutide (TRULICITY) 3 MG/0.5ML SOPN Inject 3 mg as directed once a week.   lisinopril (ZESTRIL) 5 MG tablet TAKE 1 TABLET BY MOUTH EVERY DAY IN THE EVENING   No current facility-administered medications on file prior to visit.     Allergies:  No Known Allergies  Social History:  Social History   Socioeconomic History   Marital status: Single    Spouse name: Not on file   Number of children: Not on file   Years of education: Not on file   Highest education level: Not on file  Occupational History   Not on file  Tobacco Use   Smoking status: Former    Packs/day: 1.50    Years: 22.00    Additional pack years: 0.00    Total pack years: 33.00    Types: Cigarettes    Quit date: 03/2020    Years since quitting: 2.1   Smokeless tobacco: Never  Vaping Use   Vaping Use: Never used  Substance and Sexual Activity   Alcohol use: Not Currently    Alcohol/week: 5.0 standard drinks of alcohol    Types: 4 Shots of liquor, 1 Standard drinks or equivalent per week   Drug use: No   Sexual activity: Yes    Birth control/protection: None  Other Topics Concern   Not on file  Social History Narrative   Not on file   Social Determinants of Health   Financial Resource Strain: Not on file  Food Insecurity: Not on file  Transportation Needs: Not on file  Physical Activity: Not on file  Stress: Not on file  Social Connections: Not on file  Intimate Partner Violence: Not on file   Social History   Tobacco Use  Smoking Status Former   Packs/day: 1.50   Years: 22.00   Additional pack years: 0.00   Total pack years: 33.00   Types: Cigarettes   Quit date: 03/2020   Years since quitting: 2.1  Smokeless Tobacco Never   Social History   Substance and Sexual Activity  Alcohol Use Not Currently   Alcohol/week: 5.0 standard drinks of alcohol   Types: 4 Shots of liquor, 1 Standard drinks or equivalent per week    Family History:  Family History  Problem Relation Age of Onset   Diabetes Mother    Hyperlipidemia Mother    Hypertension Mother     Past medical history, surgical history, medications, allergies, family history and social history reviewed with patient today and changes made to appropriate  areas of the chart.   Review of Systems  HENT:         Denies vision changes.  Eyes:  Negative for blurred vision and double vision.  Respiratory:  Negative for shortness of breath.   Cardiovascular:  Negative for chest pain, palpitations and leg swelling.  Neurological:  Negative for dizziness, tingling and headaches.  Endo/Heme/Allergies:  Negative for polydipsia.       Denies Polyuria   All other ROS negative except what is listed above and in the HPI.      Objective:    BP 110/80   Pulse 78  Temp 98.1 F (36.7 C) (Oral)   Ht 4' 11.02" (1.499 m)   Wt 239 lb 9.6 oz (108.7 kg)   SpO2 100%   BMI 48.37 kg/m   Wt Readings from Last 3 Encounters:  05/21/22 239 lb 9.6 oz (108.7 kg)  04/16/22 238 lb 6.4 oz (108.1 kg)  04/12/22 240 lb (108.9 kg)    Physical Exam Vitals and nursing note reviewed.  Constitutional:      General: She is awake. She is not in acute distress.    Appearance: Normal appearance. She is well-developed. She is obese. She is not ill-appearing.  HENT:     Head: Normocephalic and atraumatic.     Right Ear: Hearing, tympanic membrane, ear canal and external ear normal. No drainage.     Left Ear: Hearing, tympanic membrane, ear canal and external ear normal. No drainage.     Nose: Nose normal.     Right Sinus: No maxillary sinus tenderness or frontal sinus tenderness.     Left Sinus: No maxillary sinus tenderness or frontal sinus tenderness.     Mouth/Throat:     Mouth: Mucous membranes are moist.     Pharynx: Oropharynx is clear. Uvula midline. No pharyngeal swelling, oropharyngeal exudate or posterior oropharyngeal erythema.  Eyes:     General: Lids are normal.        Right eye: No discharge.        Left eye: No discharge.     Extraocular Movements: Extraocular movements intact.     Conjunctiva/sclera: Conjunctivae normal.     Pupils: Pupils are equal, round, and reactive to light.     Visual Fields: Right eye visual fields normal and left eye visual  fields normal.  Neck:     Thyroid: No thyromegaly.     Vascular: No carotid bruit.     Trachea: Trachea normal.  Cardiovascular:     Rate and Rhythm: Normal rate and regular rhythm.     Heart sounds: Normal heart sounds. No murmur heard.    No gallop.  Pulmonary:     Effort: Pulmonary effort is normal. No accessory muscle usage or respiratory distress.     Breath sounds: Normal breath sounds.  Chest:  Breasts:    Right: Normal.     Left: Normal.  Abdominal:     General: Bowel sounds are normal.     Palpations: Abdomen is soft. There is no hepatomegaly or splenomegaly.     Tenderness: There is no abdominal tenderness.  Musculoskeletal:        General: Normal range of motion.     Cervical back: Normal range of motion and neck supple.     Right lower leg: No edema.     Left lower leg: No edema.  Lymphadenopathy:     Head:     Right side of head: No submental, submandibular, tonsillar, preauricular or posterior auricular adenopathy.     Left side of head: No submental, submandibular, tonsillar, preauricular or posterior auricular adenopathy.     Cervical: No cervical adenopathy.     Upper Body:     Right upper body: No supraclavicular, axillary or pectoral adenopathy.     Left upper body: No supraclavicular, axillary or pectoral adenopathy.  Skin:    General: Skin is warm and dry.     Capillary Refill: Capillary refill takes less than 2 seconds.     Findings: No rash.  Neurological:     Mental Status: She is alert and oriented to person, place,  and time.     Gait: Gait is intact.  Psychiatric:        Attention and Perception: Attention normal.        Mood and Affect: Mood normal.        Speech: Speech normal.        Behavior: Behavior normal. Behavior is cooperative.        Thought Content: Thought content normal.        Judgment: Judgment normal.     Results for orders placed or performed during the hospital encounter of 04/12/22  Resp panel by RT-PCR (RSV, Flu A&B,  Covid) Anterior Nasal Swab   Specimen: Anterior Nasal Swab  Result Value Ref Range   SARS Coronavirus 2 by RT PCR POSITIVE (A) NEGATIVE   Influenza A by PCR NEGATIVE NEGATIVE   Influenza B by PCR NEGATIVE NEGATIVE   Resp Syncytial Virus by PCR NEGATIVE NEGATIVE  CBG monitoring, ED  Result Value Ref Range   Glucose-Capillary 213 (H) 70 - 99 mg/dL      Assessment & Plan:   Problem List Items Addressed This Visit       Cardiovascular and Mediastinum   Essential hypertension--Lisinopril    Chronic.  Controlled.  Continue with current medication regimen of Lisinopril 5mg  daily.  Labs ordered today.  Return to clinic in 3 months for reevaluation.  Call sooner if concerns arise.          Endocrine   Controlled diabetes mellitus type 2 with complications    Chronic. Last A1c was 8.2 in January.  Tolerating Trulicity 3mg  weekly.  Will likely need to increase dose to 4.5mg  weekly.  Continue with [place].  Labs ordered today.  Follow up in 3 months.  Call sooner if concerns arise.       Relevant Orders   HgB A1c     Other   Morbid obesity (HCC) 260 lb    Recommended eating smaller high protein, low fat meals more frequently and exercising 30 mins a day 5 times a week with a goal of 10-15lb weight loss in the next 3 months.       Other Visit Diagnoses     Annual physical exam    -  Primary   Health maintenance reviewed during visit today.  Labs ordered.  Vaccines up to date. Colonoscopy due later this year.  Mammogram Ordered. PAP up to date.   Relevant Orders   CBC with Differential/Platelet   Comprehensive metabolic panel   Lipid panel   TSH   Urinalysis, Routine w reflex microscopic   HgB A1c   Screening for ischemic heart disease       Relevant Orders   Lipid panel   Encounter for screening mammogram for malignant neoplasm of breast       Relevant Orders   MM 3D SCREENING MAMMOGRAM BILATERAL BREAST        Follow up plan: Return in about 3 months (around 08/20/2022)  for HTN, HLD, DM2 FU.   LABORATORY TESTING:  - Pap smear: up to date  IMMUNIZATIONS:   - Tdap: Tetanus vaccination status reviewed: last tetanus booster within 10 years. - Influenza: Postponed to flu season - Pneumovax: Up to date - Prevnar: Not applicable - COVID: Not applicable - HPV: Not applicable - Shingrix vaccine: Not applicable  SCREENING: -Mammogram: Ordered today  - Colonoscopy: Up to date  - Bone Density: Not applicable  -Hearing Test: Not applicable  -Spirometry: Not applicable   PATIENT COUNSELING:  Advised to take 1 mg of folate supplement per day if capable of pregnancy.   Sexuality: Discussed sexually transmitted diseases, partner selection, use of condoms, avoidance of unintended pregnancy  and contraceptive alternatives.   Advised to avoid cigarette smoking.  I discussed with the patient that most people either abstain from alcohol or drink within safe limits (<=14/week and <=4 drinks/occasion for males, <=7/weeks and <= 3 drinks/occasion for females) and that the risk for alcohol disorders and other health effects rises proportionally with the number of drinks per week and how often a drinker exceeds daily limits.  Discussed cessation/primary prevention of drug use and availability of treatment for abuse.   Diet: Encouraged to adjust caloric intake to maintain  or achieve ideal body weight, to reduce intake of dietary saturated fat and total fat, to limit sodium intake by avoiding high sodium foods and not adding table salt, and to maintain adequate dietary potassium and calcium preferably from fresh fruits, vegetables, and low-fat dairy products.    stressed the importance of regular exercise  Injury prevention: Discussed safety belts, safety helmets, smoke detector, smoking near bedding or upholstery.   Dental health: Discussed importance of regular tooth brushing, flossing, and dental visits.    NEXT PREVENTATIVE PHYSICAL DUE IN 1 YEAR. Return in  about 3 months (around 08/20/2022) for HTN, HLD, DM2 FU.

## 2022-05-22 ENCOUNTER — Encounter: Payer: Self-pay | Admitting: Nurse Practitioner

## 2022-05-22 LAB — COMPREHENSIVE METABOLIC PANEL
ALT: 27 IU/L (ref 0–32)
AST: 14 IU/L (ref 0–40)
Albumin/Globulin Ratio: 1.6 (ref 1.2–2.2)
Albumin: 4.2 g/dL (ref 3.9–4.9)
Alkaline Phosphatase: 127 IU/L — ABNORMAL HIGH (ref 44–121)
BUN/Creatinine Ratio: 9 (ref 9–23)
BUN: 8 mg/dL (ref 6–24)
Bilirubin Total: 0.5 mg/dL (ref 0.0–1.2)
CO2: 23 mmol/L (ref 20–29)
Calcium: 9.4 mg/dL (ref 8.7–10.2)
Chloride: 101 mmol/L (ref 96–106)
Creatinine, Ser: 0.93 mg/dL (ref 0.57–1.00)
Globulin, Total: 2.6 g/dL (ref 1.5–4.5)
Glucose: 245 mg/dL — ABNORMAL HIGH (ref 70–99)
Potassium: 4 mmol/L (ref 3.5–5.2)
Sodium: 139 mmol/L (ref 134–144)
Total Protein: 6.8 g/dL (ref 6.0–8.5)
eGFR: 77 mL/min/{1.73_m2} (ref >59)

## 2022-05-22 LAB — CBC WITH DIFFERENTIAL/PLATELET
Basophils Absolute: 0.1 10*3/uL (ref 0.0–0.2)
Basos: 1 %
EOS (ABSOLUTE): 0.1 10*3/uL (ref 0.0–0.4)
Eos: 1 %
Hematocrit: 43 % (ref 34.0–46.6)
Hemoglobin: 13.6 g/dL (ref 11.1–15.9)
Immature Grans (Abs): 0 10*3/uL (ref 0.0–0.1)
Immature Granulocytes: 0 %
Lymphocytes Absolute: 1.8 10*3/uL (ref 0.7–3.1)
Lymphs: 30 %
MCH: 29.4 pg (ref 26.6–33.0)
MCHC: 31.6 g/dL (ref 31.5–35.7)
MCV: 93 fL (ref 79–97)
Monocytes Absolute: 0.6 10*3/uL (ref 0.1–0.9)
Monocytes: 10 %
Neutrophils Absolute: 3.4 10*3/uL (ref 1.4–7.0)
Neutrophils: 58 %
Platelets: 302 10*3/uL (ref 150–450)
RBC: 4.62 x10E6/uL (ref 3.77–5.28)
RDW: 11.7 % (ref 11.7–15.4)
WBC: 5.9 10*3/uL (ref 3.4–10.8)

## 2022-05-22 LAB — LIPID PANEL
Chol/HDL Ratio: 2 ratio (ref 0.0–4.4)
Cholesterol, Total: 148 mg/dL (ref 100–199)
HDL: 75 mg/dL (ref >39)
LDL Chol Calc (NIH): 57 mg/dL (ref 0–99)
Triglycerides: 85 mg/dL (ref 0–149)
VLDL Cholesterol Cal: 16 mg/dL (ref 5–40)

## 2022-05-22 LAB — HEMOGLOBIN A1C
Est. average glucose Bld gHb Est-mCnc: 223 mg/dL
Hgb A1c MFr Bld: 9.4 % — ABNORMAL HIGH (ref 4.8–5.6)

## 2022-05-22 LAB — TSH: TSH: 1.74 u[IU]/mL (ref 0.450–4.500)

## 2022-05-22 MED ORDER — GLIPIZIDE 5 MG PO TABS: 5.0000 mg | ORAL_TABLET | Freq: Two times a day (BID) | ORAL | 1 refills | Status: DC

## 2022-05-22 MED ORDER — TRULICITY 4.5 MG/0.5ML ~~LOC~~ SOAJ: 4.5000 mg | SUBCUTANEOUS | 1 refills | Status: DC

## 2022-05-22 NOTE — Progress Notes (Signed)
HI Ms. Carmen [name]. It was nice to see you yesterday.  Your lab work shows that your A1c increased to 9.4%.  I recommend increasing your Trulicity to 4.5mg  weekly.   I also think adding another medication called Glipizide once daily will help bring down our A1c.  If you agree to these changes I will send them into the pharmacy.  Otherwise, continue with your current medication regimen.  Follow up as discussed.  Please let me know if you have any questions.

## 2022-06-05 ENCOUNTER — Encounter: Payer: Self-pay | Admitting: Nurse Practitioner

## 2022-06-05 DIAGNOSIS — T781XXA Other adverse food reactions, not elsewhere classified, initial encounter: Secondary | ICD-10-CM

## 2022-06-10 ENCOUNTER — Encounter: Payer: Self-pay | Admitting: Nurse Practitioner

## 2022-06-12 ENCOUNTER — Telehealth: Payer: Self-pay

## 2022-06-12 NOTE — Telephone Encounter (Addendum)
Spoken to patient and inform her that she is not due until 11/2022. Asked if she would like to schedule now or closer to October.  Patient stated remind her instead because she does not want to schedule yet.

## 2022-06-12 NOTE — Telephone Encounter (Signed)
Patient received a recall message on her mychart stating it was time to schedule her repeat colonoscopy with Dr. Tobi Bastos. Patient is ready to schedule this. Please call patient back.

## 2022-07-08 ENCOUNTER — Inpatient Hospital Stay
Admission: RE | Admit: 2022-07-08 | Discharge: 2022-07-08 | Disposition: A | Discharge status: Home / Self Care | Payer: Self-pay | Source: Ambulatory Visit | Attending: Nurse Practitioner | Admitting: Nurse Practitioner

## 2022-07-08 ENCOUNTER — Other Ambulatory Visit: Payer: Self-pay | Admitting: *Deleted

## 2022-07-08 DIAGNOSIS — Z1231 Encounter for screening mammogram for malignant neoplasm of breast: Secondary | ICD-10-CM

## 2022-07-09 MED ORDER — SEMAGLUTIDE (1 MG/DOSE) 4 MG/3ML ~~LOC~~ SOPN: 1.0000 mg | PEN_INJECTOR | SUBCUTANEOUS | 1 refills | Status: DC

## 2022-07-17 ENCOUNTER — Ambulatory Visit
Admission: RE | Admit: 2022-07-17 | Discharge: 2022-07-17 | Disposition: A | Discharge status: Home / Self Care | Payer: 59 | Source: Ambulatory Visit | Attending: Nurse Practitioner | Admitting: Nurse Practitioner

## 2022-07-17 DIAGNOSIS — Z1231 Encounter for screening mammogram for malignant neoplasm of breast: Secondary | ICD-10-CM | POA: Diagnosis present

## 2022-07-18 NOTE — Progress Notes (Signed)
Please let patient know her Mammogram did not show any evidence of a malignancy.  The recommendation is to repeat the Mammogram in 1 year.  

## 2022-07-23 ENCOUNTER — Encounter: Payer: Self-pay | Admitting: Emergency Medicine

## 2022-07-23 ENCOUNTER — Emergency Department
Admission: EM | Admit: 2022-07-23 | Discharge: 2022-07-24 | Disposition: A | Discharge status: Home / Self Care | Payer: 59 | Attending: Emergency Medicine | Admitting: Emergency Medicine

## 2022-07-23 ENCOUNTER — Other Ambulatory Visit: Payer: Self-pay

## 2022-07-23 ENCOUNTER — Emergency Department: Payer: 59

## 2022-07-23 DIAGNOSIS — R519 Headache, unspecified: Secondary | ICD-10-CM | POA: Insufficient documentation

## 2022-07-23 DIAGNOSIS — E119 Type 2 diabetes mellitus without complications: Secondary | ICD-10-CM | POA: Diagnosis not present

## 2022-07-23 DIAGNOSIS — I10 Essential (primary) hypertension: Secondary | ICD-10-CM | POA: Insufficient documentation

## 2022-07-23 LAB — CBC WITH DIFFERENTIAL/PLATELET
Abs Immature Granulocytes: 0.03 10*3/uL (ref 0.00–0.07)
Basophils Absolute: 0.1 10*3/uL (ref 0.0–0.1)
Basophils Relative: 1 %
Eosinophils Absolute: 0.1 10*3/uL (ref 0.0–0.5)
Eosinophils Relative: 1 %
HCT: 42.3 % (ref 36.0–46.0)
Hemoglobin: 14 g/dL (ref 12.0–15.0)
Immature Granulocytes: 0 %
Lymphocytes Relative: 27 %
Lymphs Abs: 2.3 10*3/uL (ref 0.7–4.0)
MCH: 30.1 pg (ref 26.0–34.0)
MCHC: 33.1 g/dL (ref 30.0–36.0)
MCV: 91 fL (ref 80.0–100.0)
Monocytes Absolute: 0.7 10*3/uL (ref 0.1–1.0)
Monocytes Relative: 8 %
Neutro Abs: 5.3 10*3/uL (ref 1.7–7.7)
Neutrophils Relative %: 63 %
Platelets: 314 10*3/uL (ref 150–400)
RBC: 4.65 MIL/uL (ref 3.87–5.11)
RDW: 11.9 % (ref 11.5–15.5)
WBC: 8.4 10*3/uL (ref 4.0–10.5)
nRBC: 0 % (ref 0.0–0.2)

## 2022-07-23 LAB — BASIC METABOLIC PANEL
Anion gap: 11 (ref 5–15)
BUN: 11 mg/dL (ref 6–20)
CO2: 21 mmol/L — ABNORMAL LOW (ref 22–32)
Calcium: 8.7 mg/dL — ABNORMAL LOW (ref 8.9–10.3)
Chloride: 105 mmol/L (ref 98–111)
Creatinine, Ser: 0.72 mg/dL (ref 0.44–1.00)
GFR, Estimated: >60 mL/min (ref >60)
Glucose, Bld: 144 mg/dL — ABNORMAL HIGH (ref 70–99)
Potassium: 4.3 mmol/L (ref 3.5–5.1)
Sodium: 137 mmol/L (ref 135–145)

## 2022-07-23 MED ORDER — DIPHENHYDRAMINE HCL 50 MG/ML IJ SOLN
25.0000 mg | Freq: Once | INTRAMUSCULAR | Status: AC
  Administered 2022-07-23: 25 mg via INTRAVENOUS
  Filled 2022-07-23: qty 1

## 2022-07-23 MED ORDER — PROCHLORPERAZINE EDISYLATE 10 MG/2ML IJ SOLN
10.0000 mg | Freq: Once | INTRAMUSCULAR | Status: AC
  Administered 2022-07-23: 10 mg via INTRAVENOUS
  Filled 2022-07-23: qty 2

## 2022-07-23 NOTE — ED Provider Notes (Signed)
[facility] Provider Note    Event Date/Time   First MD Initiated Contact with Patient 07/23/22 2031     (approximate)   History   Chief Complaint Headache   HPI  Carmen [name] is a 47 y.o. female with past medical history of hypertension, hyperlipidemia, and diabetes who presents to the ED complaining of headache.  Patient reports that she has had 2 days of gradually worsening headache primarily affecting the occipital portion of her head.  She describes the pain as a constant throbbing that does not seem to be exacerbated or alleviated by nothing in particular.  She has been feeling nauseous but has not vomited, denies any light sensitivity.  She has not had any fevers or neck stiffness, does state that the back of her neck felt swollen earlier but has since improved.  She has not had any vision changes, speech changes, numbness, or weakness.  She has not taken anything for her headache prior to arrival.     Physical Exam   Triage Vital Signs: ED Triage Vitals  Enc Vitals Group     BP 07/23/22 1823 (!) 136/92     Pulse Rate 07/23/22 1823 (!) 105     Resp 07/23/22 1823 16     Temp 07/23/22 1823 98.2 F (36.8 C)     Temp Source 07/23/22 1823 Oral     SpO2 07/23/22 1823 97 %     Weight 07/23/22 1821 243 lb (110.2 kg)     Height 07/23/22 1821 4\' 11"  (1.499 m)     Head Circumference --      Peak Flow --      Pain Score 07/23/22 1821 8     Pain Loc --      Pain Edu? --      Excl. in GC? --     Most recent vital signs: Vitals:   07/23/22 1823  BP: (!) 136/92  Pulse: (!) 105  Resp: 16  Temp: 98.2 F (36.8 C)  SpO2: 97%    Constitutional: Alert and oriented. Eyes: Conjunctivae are normal. Head: Atraumatic. Nose: No congestion/rhinnorhea. Mouth/Throat: Mucous membranes are moist.  Neck: Supple with no meningismus.  No edema or tenderness noted. Cardiovascular: Normal rate, regular rhythm. Grossly normal heart sounds.  2+ radial  pulses bilaterally. Respiratory: Normal respiratory effort.  No retractions. Lungs CTAB. Gastrointestinal: Soft and nontender. No distention. Musculoskeletal: No lower extremity tenderness nor edema.  Neurologic:  Normal speech and language. No gross focal neurologic deficits are appreciated.    ED Results / Procedures / Treatments   Labs (all labs ordered are listed, but only abnormal results are displayed) Labs Reviewed  BASIC METABOLIC PANEL - Abnormal; Notable for the following components:      Result Value   CO2 21 (*)    Glucose, Bld 144 (*)    Calcium 8.7 (*)    All other components within normal limits  CBC WITH DIFFERENTIAL/PLATELET  POC URINE PREG, ED    RADIOLOGY CT head reviewed and interpreted by me with no hemorrhage or midline shift.  PROCEDURES:  Critical Care performed: No  Procedures   MEDICATIONS ORDERED IN ED: Medications  prochlorperazine (COMPAZINE) injection 10 mg (has no administration in time range)  diphenhydrAMINE (BENADRYL) injection 25 mg (has no administration in time range)     IMPRESSION / MDM / ASSESSMENT AND PLAN / ED COURSE  I reviewed the triage vital signs and the nursing notes.  47 y.o. female with past medical history of hypertension, hyperlipidemia, and diabetes who presents to the ED with gradually worsening headache over the past 2 days associated with nausea.  Patient's presentation is most consistent with acute presentation with potential threat to life or bodily function.  Differential diagnosis includes, but is not limited to, SAH, meningitis, tension headache, migraine headache, occipital neuralgia, hyperglycemia, dehydration, electrolyte abnormality.  Patient nontoxic-appearing and in no acute distress, vital signs are unremarkable.  She reports some swelling to the back of her neck earlier but I am not able to appreciate this on exam, no signs of soft tissue infection.  No findings  concerning for meningitis or SAH, and she has a nonfocal neurologic exam.  Given her history of diabetes with poor oral intake, will screen labs.  Plan to treat symptomatically with IV Compazine and Benadryl.  Labs are reassuring with no significant anemia, leukocytosis, lecture abnormality, or AKI.  CT head is unremarkable, low suspicion for [facility] given gradually worsening headache.  Patient turned over to on provider pending reassessment following symptomatic treatment.      FINAL CLINICAL IMPRESSION(S) / ED DIAGNOSES   Final diagnoses:  Acute nonintractable headache, unspecified headache type     Rx / DC Orders   ED Discharge Orders     None        Note:  This document was prepared using Dragon voice recognition software and may include unintentional dictation errors.   Chesley Noon, MD 07/23/22 [phone]

## 2022-07-23 NOTE — ED Notes (Signed)
Pt requesting administration of medications without POCT u-preg due to pt's confidence in a negative result. Pt A&O x4. Updated provider.

## 2022-07-23 NOTE — ED Triage Notes (Signed)
Pt to ED via POV c/o headache for the last few days. Pt states that she also has swelling in her neck. Pt states that she is having some nausea as well. Pt denies fever. Pt is in NAD.

## 2022-07-23 NOTE — Discharge Instructions (Addendum)
You may take Compazine as needed for headache/nausea.  Return to the ER for worsening symptoms, persistent vomiting, lethargy or other concerns °

## 2022-07-24 MED ORDER — PROCHLORPERAZINE MALEATE 10 MG PO TABS: 10.0000 mg | ORAL_TABLET | Freq: Four times a day (QID) | ORAL | 0 refills | Status: AC | PRN

## 2022-07-24 NOTE — ED Provider Notes (Signed)
-----------------------------------------   12:48 AM on 07/24/2022 -----------------------------------------   Headache significantly improved.  Strict return precautions given.  Patient and family member verbalized understanding agree with plan of care.   Irean Hong, MD 07/24/22 [phone]

## 2022-07-28 ENCOUNTER — Telehealth: Payer: Self-pay

## 2022-07-28 NOTE — Transitions of Care (Post Inpatient/ED Visit) (Signed)
   07/28/2022  Name: Carmen [name] MRN: [id number] DOB: Oct 19, 1975  Today's TOC FU Call Status: Today's TOC FU Call Status:: Successful TOC FU Call Competed TOC FU Call Complete Date: 07/28/22  Transition Care Management Follow-up Telephone Call Date of Discharge: 07/23/22 Discharge Facility: [facility]) Type of Discharge: Emergency Department Reason for ED Visit: Neurologic Neurologic Diagnosis:  (Headache) How have you been since you were released from the hospital?: Better Any questions or concerns?: No  Items Reviewed: Did you receive and understand the discharge instructions provided?: Yes Medications obtained,verified, and reconciled?: Yes (Medications Reviewed) Any new allergies since your discharge?: No Dietary orders reviewed?: NA Do you have support at home?: Yes People in Home: parent(s)  Medications Reviewed Today: Medications Reviewed Today     Reviewed by Pablo Ledger, CMA (Certified Medical Assistant) on 07/28/22 at [phone]  Med List Status: <None>   Medication Order Taking? Sig Documenting Provider Last Dose Status Informant  atorvastatin (LIPITOR) 20 MG tablet [id number] Yes TAKE 1 TABLET BY MOUTH EVERY DAY IN THE Charolotte Eke, NP Taking Active   dapagliflozin propanediol (FARXIGA) 10 MG TABS tablet [id number] Yes Take 1 tablet (10 mg total) by mouth daily before breakfast. Larae Grooms, NP Taking Active   glipiZIDE (GLUCOTROL) 5 MG tablet [id number] Yes Take 1 tablet (5 mg total) by mouth 2 (two) times daily before a meal. Larae Grooms, NP Taking Active   lisinopril (ZESTRIL) 5 MG tablet [id number] Yes TAKE 1 TABLET BY MOUTH EVERY DAY IN THE Charolotte Eke, NP Taking Active   prochlorperazine (COMPAZINE) 10 MG tablet [id number] Yes Take 1 tablet (10 mg total) by mouth every 6 (six) hours as needed for nausea (headache). Irean Hong, MD Taking Active   Semaglutide, 1 MG/DOSE, 4 MG/3ML Namon Cirri  [id number] Yes Inject 1 mg as directed once a week. Larae Grooms, NP Taking Active             Home Care and Equipment/Supplies: Were Home Health Services Ordered?: NA Any new equipment or medical supplies ordered?: NA  Functional Questionnaire: Do you need assistance with bathing/showering or dressing?: No Do you need assistance with meal preparation?: No Do you need assistance with eating?: No Do you have difficulty maintaining continence: No Do you need assistance with getting out of bed/getting out of a chair/moving?: No Do you have difficulty managing or taking your medications?: No  Follow up appointments reviewed: PCP Follow-up appointment confirmed?: No MD Provider Line Number:[phone] Given: No Specialist Hospital Follow-up appointment confirmed?: No Do you need transportation to your follow-up appointment?: No Do you understand care options if your condition(s) worsen?: Yes-patient verbalized understanding    SIGNATURE: Wilhemena Durie, CMA

## 2022-08-01 ENCOUNTER — Encounter: Payer: Self-pay | Admitting: Nurse Practitioner

## 2022-08-04 MED ORDER — SEMAGLUTIDE (1 MG/DOSE) 4 MG/3ML ~~LOC~~ SOPN: 1.0000 mg | PEN_INJECTOR | SUBCUTANEOUS | 1 refills | Status: DC

## 2022-08-20 ENCOUNTER — Ambulatory Visit: Payer: 59 | Admitting: Physician Assistant

## 2022-08-25 ENCOUNTER — Ambulatory Visit: Payer: 59 | Admitting: Physician Assistant

## 2022-08-25 ENCOUNTER — Encounter: Payer: Self-pay | Admitting: Physician Assistant

## 2022-08-25 VITALS — BP 112/79 | HR 91 | Temp 98.3°F | Wt 239.2 lb

## 2022-08-25 DIAGNOSIS — E1169 Type 2 diabetes mellitus with other specified complication: Secondary | ICD-10-CM

## 2022-08-25 DIAGNOSIS — I10 Essential (primary) hypertension: Secondary | ICD-10-CM | POA: Diagnosis not present

## 2022-08-25 LAB — BAYER DCA HB A1C WAIVED: HB A1C (BAYER DCA - WAIVED): 7.7 % — ABNORMAL HIGH (ref 4.8–5.6)

## 2022-08-25 MED ORDER — LISINOPRIL 5 MG PO TABS: ORAL_TABLET | ORAL | 1 refills | Status: DC

## 2022-08-25 MED ORDER — ATORVASTATIN CALCIUM 20 MG PO TABS: ORAL_TABLET | ORAL | 1 refills | Status: DC

## 2022-08-25 MED ORDER — DAPAGLIFLOZIN PROPANEDIOL 10 MG PO TABS: 10.0000 mg | ORAL_TABLET | Freq: Every day | ORAL | 1 refills | Status: DC

## 2022-08-25 MED ORDER — SEMAGLUTIDE (1 MG/DOSE) 4 MG/3ML ~~LOC~~ SOPN: 1.0000 mg | PEN_INJECTOR | SUBCUTANEOUS | 1 refills | Status: DC

## 2022-08-25 NOTE — Assessment & Plan Note (Signed)
Chronic, historic condition  Currently managed with Lisinopril 5 mg PO every day and appears to be tolerating well Continue current regimen Follow up in 3 months or sooner if concerns arise

## 2022-08-25 NOTE — Assessment & Plan Note (Signed)
Chronic, historic condition Currently taking Glipizide 5 mg PO BID, Farxiga 10 mg PO every day, Semaglutide 1 mg weekly Continue current regimen Recheck A1c today- results to dictate further management Reviewed importance of yearly eye exams, foot exam completed today Reviewed importance of diet with current medications- recommend eating regular meals and snacks composed of protein, vegetables, and carbs - in this order of priority  Follow up in 3 months or sooner if concerns arise

## 2022-08-25 NOTE — Progress Notes (Signed)
Your A1c has improved to 7.7  This is still a bit over goal but I think if we continue your medications it will continue to improve We can recheck in 3 months and if it is still above goal at that time we can make medication changes. Let us know if you have further questions or concerns.

## 2022-08-25 NOTE — Progress Notes (Deleted)
          Acute Office Visit   Patient: Carmen [name]   DOB: [birth year]   47 y.o. Female  MRN: [id number] Visit Date: 08/25/2022  Today's healthcare provider: Oswaldo Conroy Josslynn Mentzer, PA-C  Introduced myself to the patient as a Secondary school teacher and provided education on APPs in clinical practice.    Chief Complaint  Patient presents with   Hyperlipidemia   Hypertension   Diabetes   Subjective    HPI    HYPERTENSION / HYPERLIPIDEMIA Satisfied with current treatment? {Blank single:19197::"yes","no"} Duration of hypertension: {Blank single:19197::"chronic","months","years"} BP monitoring frequency: {Blank single:19197::"not checking","rarely","daily","weekly","monthly","a few times a day","a few times a week","a few times a month"} BP range:  BP medication side effects: no Past BP meds: lisinopril Duration of hyperlipidemia: chronic Cholesterol medication side effects: no Cholesterol supplements: none Past cholesterol medications: atorvastain (lipitor) Medication compliance: {Blank single:19197::"excellent compliance","good compliance","fair compliance","poor compliance"} Aspirin: {Blank single:19197::"yes","no"} Recent stressors: {Blank single:19197::"yes","no"} Recurrent headaches: {Blank single:19197::"yes","no"} Visual changes: {Blank single:19197::"yes","no"} Palpitations: {Blank single:19197::"yes","no"} Dyspnea: {Blank single:19197::"yes","no"} Chest pain: {Blank single:19197::"yes","no"} Lower extremity edema: {Blank single:19197::"yes","no"} Dizzy/lightheaded: {Blank single:19197::"yes","no"}  Diabetes, Type 2 - Last A1c 9.4 - Medications: Glipizide 5 mg PO BID, Farxiga 10 mg PO every day, Semaglutide 1 mg weekly - Compliance: *** - Checking BG at home: *** - Diet: *** - Exercise: *** - Eye exam: Reviewed importance of annual eye exams  - Foot exam: *** - Microalbumin: UTD - Statin: On statin therapy  - PNA vaccine: NA - Denies symptoms of hypoglycemia, polyuria,  polydipsia, numbness extremities, foot ulcers/trauma   Medications: Outpatient Medications Prior to Visit  Medication Sig   atorvastatin (LIPITOR) 20 MG tablet TAKE 1 TABLET BY MOUTH EVERY DAY IN THE EVENING   dapagliflozin propanediol (FARXIGA) 10 MG TABS tablet Take 1 tablet (10 mg total) by mouth daily before breakfast.   glipiZIDE (GLUCOTROL) 5 MG tablet Take 1 tablet (5 mg total) by mouth 2 (two) times daily before a meal.   lisinopril (ZESTRIL) 5 MG tablet TAKE 1 TABLET BY MOUTH EVERY DAY IN THE EVENING   prochlorperazine (COMPAZINE) 10 MG tablet Take 1 tablet (10 mg total) by mouth every 6 (six) hours as needed for nausea (headache).   Semaglutide, 1 MG/DOSE, 4 MG/3ML SOPN Inject 1 mg as directed once a week.   No facility-administered medications prior to visit.    Review of Systems  {Insert previous labs (optional):23779}  {See past labs  Heme  Chem  Endocrine  Serology  Results Review (optional):1}   Objective    There were no vitals taken for this visit. {Insert last BP/Wt (optional):23777}  {See vitals history (optional):1}  Physical Exam    No results found for any visits on 08/25/22.  Assessment & Plan      No follow-ups on file.

## 2022-08-25 NOTE — Patient Instructions (Signed)
  You can try using Pepcid to help prevent the stomach issues  You can use TUMS or Pepto to help treat the flares

## 2022-08-25 NOTE — Progress Notes (Signed)
Established Patient Office Visit  Name: Carmen [name]   MRN: [id number]    DOB: 1975-10-23   Date:08/25/2022  Today's Provider: Jacquelin Hawking, MHS, PA-C Introduced myself to the patient as a PA-C and provided education on APPs in clinical practice.         Subjective  Chief Complaint  Chief Complaint  Patient presents with   Hyperlipidemia   Hypertension   Diabetes    Patient is scheduled for an upcoming Diabetic Eye Exam.     HPI    HYPERTENSION / HYPERLIPIDEMIA Satisfied with current treatment? yes Duration of hypertension: years BP monitoring frequency: not checking BP range:  BP medication side effects: no Past BP meds: lisinopril Duration of hyperlipidemia: chronic Cholesterol medication side effects: no Cholesterol supplements: none Past cholesterol medications: atorvastain (lipitor) Medication compliance: good compliance Aspirin: no Recent stressors: no Recurrent headaches: no Visual changes: no Palpitations: no Dyspnea: no Chest pain: no Lower extremity edema: no Dizzy/lightheaded: no  Diabetes, Type 2 - Last A1c 9.4 - Medications: Glipizide 5 mg PO BID, Farxiga 10 mg PO every day, Semaglutide 1 mg weekly - Compliance: good compliance  - Checking BG at home: not checking  - Diet: she is trying to decrease her intake of fast foods - trying to eat more fruit and yogurt - Exercise: not engaged in regular exercise  - Eye exam: Reviewed importance of annual eye exams - she states she has one upcoming, request records to faxed to office to update our charts - Foot exam: Completed today  - Microalbumin: UTD - Statin: On statin therapy  - PNA vaccine: NA - Denies symptoms of hypoglycemia, polyuria, polydipsia, numbness extremities, foot ulcers/trauma  She reports every once in awhile she gets extremely hungry to the point where her stomach hurts She states this happens either early in the AM or if she does not follow a set meal schedule   She states this has been going on for awhile -  She states she tries to eat fruit and yogurt in the AM, has been trying to cut out fast food for breakfast    Patient Active Problem List   Diagnosis Date Noted   Morbid obesity (HCC) 260 lb 12/02/2018   Smoker 1-1 1/2 ppd 12/02/2018   --4 shots Tequila +1 margarita 11/2018 12/02/2018   Biliary dyskinesia    Encounter for smoking cessation counseling 01/05/2017   Weight loss 01/05/2017   Healthcare maintenance 01/05/2017   Anxiety 03/17/2016   Essential hypertension--Lisinopril 08/15/2015   Diabetes mellitus (HCC) 08/15/2015    Past Surgical History:  Procedure Laterality Date   CHOLECYSTECTOMY     COLONOSCOPY WITH PROPOFOL     COLONOSCOPY WITH PROPOFOL N/A 11/17/2017   Procedure: COLONOSCOPY WITH PROPOFOL;  Surgeon: Wyline Mood, MD;  Location: [facility] ENDOSCOPY;  Service: Gastroenterology;  Laterality: N/A;   ESOPHAGOGASTRODUODENOSCOPY (EGD) WITH PROPOFOL N/A 02/17/2017   Procedure: ESOPHAGOGASTRODUODENOSCOPY (EGD) WITH PROPOFOL;  Surgeon: Wyline Mood, MD;  Location: [facility] ENDOSCOPY;  Service: Gastroenterology;  Laterality: N/A;   ROBOTIC ASSISTED LAPAROSCOPIC CHOLECYSTECTOMY N/A 04/23/2017   Procedure: ROBOTIC ASSISTED LAPAROSCOPIC CHOLECYSTECTOMY;  Surgeon: Leafy Ro, MD;  Location: [facility] ORS;  Service: General;  Laterality: N/A;    Family History  Problem Relation Age of Onset   Diabetes Mother    Hyperlipidemia Mother    Hypertension Mother    Breast cancer Neg Hx     Social History   Tobacco Use   Smoking status: Former  Current packs/day: 0.00    Average packs/day: 1.5 packs/day for 22.0 years (33.0 ttl pk-yrs)    Types: Cigarettes    Start date: 03/1998    Quit date: 03/2020    Years since quitting: 2.4   Smokeless tobacco: Never  Substance Use Topics   Alcohol use: Not Currently    Alcohol/week: 5.0 standard drinks of alcohol    Types: 4 Shots of liquor, 1 Standard drinks or equivalent per week      Current Outpatient Medications:    glipiZIDE (GLUCOTROL) 5 MG tablet, Take 1 tablet (5 mg total) by mouth 2 (two) times daily before a meal., Disp: 180 tablet, Rfl: 1   prochlorperazine (COMPAZINE) 10 MG tablet, Take 1 tablet (10 mg total) by mouth every 6 (six) hours as needed for nausea (headache)., Disp: 20 tablet, Rfl: 0   atorvastatin (LIPITOR) 20 MG tablet, TAKE 1 TABLET BY MOUTH EVERY DAY IN THE EVENING, Disp: 90 tablet, Rfl: 1   dapagliflozin propanediol (FARXIGA) 10 MG TABS tablet, Take 1 tablet (10 mg total) by mouth daily before breakfast., Disp: 90 tablet, Rfl: 1   lisinopril (ZESTRIL) 5 MG tablet, TAKE 1 TABLET BY MOUTH EVERY DAY IN THE EVENING, Disp: 90 tablet, Rfl: 1   Semaglutide, 1 MG/DOSE, 4 MG/3ML SOPN, Inject 1 mg as directed once a week., Disp: 3 mL, Rfl: 1  No Known Allergies  I personally reviewed active problem list, medication list, allergies, health maintenance, notes from last encounter, lab results with the patient/caregiver today.   Review of Systems  Eyes:  Negative for blurred vision, double vision and photophobia.  Respiratory:  Negative for shortness of breath and wheezing.   Cardiovascular:  Negative for chest pain, palpitations and leg swelling.  Gastrointestinal:  Positive for heartburn and nausea. Negative for blood in stool, constipation, diarrhea and vomiting.  Neurological:  Negative for dizziness, tingling, tremors and headaches.      Objective  Vitals:   08/25/22 1045  BP: 112/79  Pulse: 91  Temp: 98.3 F (36.8 C)  TempSrc: Oral  SpO2: 98%  Weight: 239 lb 3.2 oz (108.5 kg)    Body mass index is 48.31 kg/m.  Physical Exam Vitals reviewed.  Constitutional:      General: She is awake.     Appearance: Normal appearance. She is well-developed and well-groomed.  HENT:     Head: Normocephalic and atraumatic.  Cardiovascular:     Rate and Rhythm: Normal rate and regular rhythm.     Pulses: Normal pulses.          Radial pulses  are 2+ on the right side and 2+ on the left side.     Heart sounds: Normal heart sounds. No murmur heard.    No friction rub. No gallop.  Pulmonary:     Effort: Pulmonary effort is normal.     Breath sounds: Normal breath sounds. No decreased air movement. No decreased breath sounds, wheezing, rhonchi or rales.  Musculoskeletal:     Right lower leg: No edema.     Left lower leg: No edema.  Skin:    General: Skin is warm and dry.  Neurological:     General: No focal deficit present.     Mental Status: She is alert and oriented to person, place, and time. Mental status is at baseline.  Psychiatric:        Mood and Affect: Mood normal.        Behavior: Behavior normal. Behavior is cooperative.  Thought Content: Thought content normal.        Judgment: Judgment normal.      Recent Results (from the past 2160 hour(s))  CBC with Differential     Status: None   Collection Time: 07/23/22 11:00 PM  Result Value Ref Range   WBC 8.4 4.0 - 10.5 K/uL   RBC 4.65 3.87 - 5.11 MIL/uL   Hemoglobin 14.0 12.0 - 15.0 g/dL   HCT 54.0 98.1 - 19.1 %   MCV 91.0 80.0 - 100.0 fL   MCH 30.1 26.0 - 34.0 pg   MCHC 33.1 30.0 - 36.0 g/dL   RDW 47.8 29.5 - 62.1 %   Platelets 314 150 - 400 K/uL   nRBC 0.0 0.0 - 0.2 %   Neutrophils Relative % 63 %   Neutro Abs 5.3 1.7 - 7.7 K/uL   Lymphocytes Relative 27 %   Lymphs Abs 2.3 0.7 - 4.0 K/uL   Monocytes Relative 8 %   Monocytes Absolute 0.7 0.1 - 1.0 K/uL   Eosinophils Relative 1 %   Eosinophils Absolute 0.1 0.0 - 0.5 K/uL   Basophils Relative 1 %   Basophils Absolute 0.1 0.0 - 0.1 K/uL   Immature Granulocytes 0 %   Abs Immature Granulocytes 0.03 0.00 - 0.07 K/uL    Comment: Performed at [facility], [address] [place], [place] [address]  Basic metabolic panel     Status: Abnormal   Collection Time: 07/23/22 11:00 PM  Result Value Ref Range   Sodium 137 135 - 145 mmol/L   Potassium 4.3 3.5 - 5.1 mmol/L    Comment: HEMOLYSIS AT  THIS LEVEL MAY AFFECT RESULT   Chloride 105 98 - 111 mmol/L   CO2 21 (L) 22 - 32 mmol/L   Glucose, Bld 144 (H) 70 - 99 mg/dL    Comment: Glucose reference range applies only to samples taken after fasting for at least 8 hours.   BUN 11 6 - 20 mg/dL   Creatinine, Ser 7.84 0.44 - 1.00 mg/dL   Calcium 8.7 (L) 8.9 - 10.3 mg/dL   GFR, Estimated >69 >62 mL/min    Comment: (NOTE) Calculated using the CKD-EPI Creatinine Equation (2021)    Anion gap 11 5 - 15    Comment: Performed at [facility], [address] [place], [place] [address]  [facility] Hb A1c Waived (STAT)     Status: Abnormal   Collection Time: 08/25/22 11:08 AM  Result Value Ref Range   HB A1C (BAYER DCA - WAIVED) 7.7 (H) 4.8 - 5.6 %    Comment:          Prediabetes: 5.7 - 6.4          Diabetes: >6.4          Glycemic control for adults with diabetes: <7.0      PHQ2/9:    08/25/2022   10:46 AM 05/21/2022   10:06 AM 02/19/2022    1:30 PM 11/06/2021    2:34 PM 08/05/2021    2:03 PM  Depression screen PHQ 2/9  Decreased Interest 0 0 1 2 1   Down, Depressed, Hopeless 0 0 1 2 1   PHQ - 2 Score 0 0 2 4 2   Altered sleeping 0 0 1 2 2   Tired, decreased energy 0 0 1 2 1   Change in appetite 1 2 1 2 2   Feeling bad or failure about yourself  0 0 0 1 1  Trouble concentrating 0 0 0  0 0  Moving slowly or fidgety/restless 0 0 0 0 0  Suicidal thoughts 0 0 0 0 0  PHQ-9 Score 1 2 5 11 8   Difficult doing work/chores  Not difficult at all Not difficult at all Somewhat difficult Somewhat difficult      Fall Risk:    08/25/2022   10:47 AM 05/21/2022   10:05 AM 02/19/2022    1:30 PM 11/06/2021    2:34 PM 08/05/2021    2:03 PM  Fall Risk   Falls in the past year? 0 0 0 0 0  Number falls in past yr: 0 0 0 0 0  Injury with Fall? 0 0 0 0 0  Risk for fall due to : No Fall Risks No Fall Risks No Fall Risks No Fall Risks No Fall Risks  Follow up Falls evaluation completed Falls evaluation completed Falls evaluation completed Falls  evaluation completed Falls evaluation completed      Functional Status Survey:      Assessment & Plan  Problem List Items Addressed This Visit       Cardiovascular and Mediastinum   Essential hypertension--Lisinopril    Chronic, historic condition  Currently managed with Lisinopril 5 mg PO every day and appears to be tolerating well Continue current regimen Follow up in 3 months or sooner if concerns arise       Relevant Medications   atorvastatin (LIPITOR) 20 MG tablet   lisinopril (ZESTRIL) 5 MG tablet     Endocrine   Diabetes mellitus (HCC) - Primary    Chronic, historic condition Currently taking Glipizide 5 mg PO BID, Farxiga 10 mg PO every day, Semaglutide 1 mg weekly Continue current regimen Recheck A1c today- results to dictate further management Reviewed importance of yearly eye exams, foot exam completed today Reviewed importance of diet with current medications- recommend eating regular meals and snacks composed of protein, vegetables, and carbs - in this order of priority  Follow up in 3 months or sooner if concerns arise       Relevant Medications   dapagliflozin propanediol (FARXIGA) 10 MG TABS tablet   Semaglutide, 1 MG/DOSE, 4 MG/3ML SOPN   atorvastatin (LIPITOR) 20 MG tablet   lisinopril (ZESTRIL) 5 MG tablet   Other Relevant Orders   [facility] Hb A1c Waived (STAT) (Completed)     Return in about 3 months (around 11/25/2022) for HTN, Diabetes follow up.   I, Auden Tatar E Moraima Burd, PA-C, have reviewed all documentation for this visit. The documentation on 08/25/22 for the exam, diagnosis, procedures, and orders are all accurate and complete.   Jacquelin Hawking, MHS, PA-C [facility]

## 2022-10-02 ENCOUNTER — Encounter: Payer: Self-pay | Admitting: Nurse Practitioner

## 2022-10-02 ENCOUNTER — Encounter: Payer: Self-pay | Admitting: Physician Assistant

## 2022-10-02 DIAGNOSIS — E1169 Type 2 diabetes mellitus with other specified complication: Secondary | ICD-10-CM

## 2022-10-02 MED ORDER — SEMAGLUTIDE (1 MG/DOSE) 4 MG/3ML ~~LOC~~ SOPN
1.0000 mg | PEN_INJECTOR | SUBCUTANEOUS | 2 refills | Status: DC
Start: 2022-10-02 — End: 2022-11-28

## 2022-10-15 ENCOUNTER — Encounter: Payer: Self-pay | Admitting: *Deleted

## 2022-10-22 ENCOUNTER — Encounter: Payer: Self-pay | Admitting: Nurse Practitioner

## 2022-10-23 ENCOUNTER — Telehealth: Payer: Self-pay | Admitting: Gastroenterology

## 2022-10-23 NOTE — Telephone Encounter (Signed)
Patient called in to schedule for her colonoscopy.

## 2022-10-23 NOTE — Telephone Encounter (Signed)
Message left for patient to return my call.  

## 2022-10-24 ENCOUNTER — Telehealth: Payer: Self-pay | Admitting: *Deleted

## 2022-10-24 ENCOUNTER — Other Ambulatory Visit: Payer: Self-pay | Admitting: *Deleted

## 2022-10-24 DIAGNOSIS — Z86010 Personal history of colonic polyps: Secondary | ICD-10-CM

## 2022-10-24 MED ORDER — NA SULFATE-K SULFATE-MG SULF 17.5-3.13-1.6 GM/177ML PO SOLN: 1.0000 | Freq: Once | ORAL | 0 refills | Status: AC

## 2022-10-24 NOTE — Telephone Encounter (Signed)
Gastroenterology Pre-Procedure Review  Request Date: 11/27/2022 Requesting Physician: Dr. Tobi Bastos   PATIENT REVIEW QUESTIONS: The patient responded to the following health history questions as indicated:    1. Are you having any GI issues? no 2. Do you have a personal history of Polyps? yes (11/17/2017) 3. Do you have a family history of Colon Cancer or Polyps? no 4. Diabetes Mellitus? yes (Farxiga, glipizide, Semaglutide) 5. Joint replacements in the past 12 months?no 6. Major health problems in the past 3 months?no 7. Any artificial heart valves, MVP, or defibrillator?no    MEDICATIONS & ALLERGIES:    Patient reports the following regarding taking any anticoagulation/antiplatelet therapy:   Plavix, Coumadin, Eliquis, Xarelto, Lovenox, Pradaxa, Brilinta, or Effient? no Aspirin? no  Patient confirms/reports the following medications:  Current Outpatient Medications  Medication Sig Dispense Refill   atorvastatin (LIPITOR) 20 MG tablet TAKE 1 TABLET BY MOUTH EVERY DAY IN THE EVENING 90 tablet 1   dapagliflozin propanediol (FARXIGA) 10 MG TABS tablet Take 1 tablet (10 mg total) by mouth daily before breakfast. 90 tablet 1   glipiZIDE (GLUCOTROL) 5 MG tablet Take 1 tablet (5 mg total) by mouth 2 (two) times daily before a meal. 180 tablet 1   lisinopril (ZESTRIL) 5 MG tablet TAKE 1 TABLET BY MOUTH EVERY DAY IN THE EVENING 90 tablet 1   prochlorperazine (COMPAZINE) 10 MG tablet Take 1 tablet (10 mg total) by mouth every 6 (six) hours as needed for nausea (headache). 20 tablet 0   Semaglutide, 1 MG/DOSE, 4 MG/3ML SOPN Inject 1 mg as directed once a week. 3 mL 2   No current facility-administered medications for this visit.    Patient confirms/reports the following allergies:  No Known Allergies  No orders of the defined types were placed in this encounter.   AUTHORIZATION INFORMATION Primary Insurance: 1D#: Group #:  Secondary Insurance: 1D#: Group #:  SCHEDULE  INFORMATION: Date: 11/27/2022 Time: Location: [facility]

## 2022-10-24 NOTE — Telephone Encounter (Signed)
Colonoscopy schedule for 11/27/2022 with Dr Tobi Bastos

## 2022-11-25 ENCOUNTER — Ambulatory Visit: Payer: 59 | Admitting: Nurse Practitioner

## 2022-11-25 ENCOUNTER — Encounter: Payer: Self-pay | Admitting: Nurse Practitioner

## 2022-11-25 VITALS — BP 138/84 | HR 85 | Temp 97.7°F | Wt 248.2 lb

## 2022-11-25 DIAGNOSIS — E1169 Type 2 diabetes mellitus with other specified complication: Secondary | ICD-10-CM | POA: Diagnosis not present

## 2022-11-25 DIAGNOSIS — I10 Essential (primary) hypertension: Secondary | ICD-10-CM | POA: Diagnosis not present

## 2022-11-25 DIAGNOSIS — Z7984 Long term (current) use of oral hypoglycemic drugs: Secondary | ICD-10-CM | POA: Diagnosis not present

## 2022-11-25 MED ORDER — GLIPIZIDE 5 MG PO TABS: 5.0000 mg | ORAL_TABLET | Freq: Two times a day (BID) | ORAL | 1 refills | Status: DC

## 2022-11-25 NOTE — Assessment & Plan Note (Signed)
Chronic. Last A1c was 7.7% in July.  Doing well with Ozempic, Farxiga and Glipizide.  Can increase dose to Ozempic 2mg  weekly. Eye exam information brought in by patient at visit today.  Labs ordered today.  Follow up in 3 months.  Call sooner if concerns arise.

## 2022-11-25 NOTE — Assessment & Plan Note (Signed)
Chronic.  Controlled.  Continue with current medication regimen of Lisinopril 5mg  daily.  Encouraged patient to be consistent with medications.  Labs ordered today.  Return to clinic in 3 months for reevaluation.  Call sooner if concerns arise.

## 2022-11-25 NOTE — Progress Notes (Signed)
BP 138/84   Pulse 85   Temp 97.7 F (36.5 C) (Oral)   Wt 248 lb 3.2 oz (112.6 kg)   LMP 11/24/2022   SpO2 100%   BMI 50.13 kg/m    Subjective:    Patient ID: Carmen [name], female    DOB: 29-Apr-1975, 47 y.o.   MRN: [id number]  HPI: Carmen [name] is a 47 y.o. female   Chief Complaint  Patient presents with   Diabetes   Hypertension   HYPERTENSION / HYPERLIPIDEMIA Satisfied with current treatment? yes Duration of hypertension: years BP monitoring frequency: not checking BP range:  BP medication side effects: no  Past BP meds: lisinopril Duration of hyperlipidemia: years Cholesterol medication side effects: no Cholesterol supplements: none Past cholesterol medications: atorvastain (lipitor) Medication compliance: excellent compliance  Aspirin: no Recent stressors: no  Recurrent headaches: no  Visual changes: no Palpitations: no Dyspnea: no Chest pain: no Lower extremity edema: no  Dizzy/lightheaded: no  DIABETES Has been consistent with Ozempic and [place].  Hypoglycemic episodes: no Polydipsia/polyuria: no Visual disturbance: no Chest pain: no Paresthesias: no Glucose Monitoring: no  Accucheck frequency: Not Checking  Fasting glucose:  Post prandial:  Evening:  Before meals: Taking Insulin?: no  Long acting insulin:  Short acting insulin: Blood Pressure Monitoring: not checking Retinal Examination: Up to date; brought in eye exam Foot Exam: up to date Diabetic Education: Completed Pneumovax: up to date Influenza: up to date Aspirin: no      Relevant past medical, surgical, family and social history reviewed and updated as indicated. Interim medical history since our last visit reviewed. Allergies and medications reviewed and updated.  Review of Systems  Constitutional:  Negative for activity change, appetite change, chills and fatigue.  Eyes:  Negative for visual disturbance.  Respiratory:  Negative for cough, chest  tightness and shortness of breath.   Cardiovascular:  Negative for chest pain, palpitations and leg swelling.  Gastrointestinal:  Negative for constipation, diarrhea, nausea and vomiting.  Endocrine: Negative for polydipsia and polyuria.  Genitourinary:  Negative for difficulty urinating and dysuria.  Neurological:  Negative for dizziness, weakness, light-headedness, numbness and headaches.    Per HPI unless specifically indicated above     Objective:    BP 138/84   Pulse 85   Temp 97.7 F (36.5 C) (Oral)   Wt 248 lb 3.2 oz (112.6 kg)   LMP 11/24/2022   SpO2 100%   BMI 50.13 kg/m   Wt Readings from Last 3 Encounters:  11/25/22 248 lb 3.2 oz (112.6 kg)  08/25/22 239 lb 3.2 oz (108.5 kg)  07/23/22 243 lb (110.2 kg)    Physical Exam Vitals and nursing note reviewed.  Constitutional:      General: She is not in acute distress.    Appearance: Normal appearance. She is obese. She is not ill-appearing, toxic-appearing or diaphoretic.  HENT:     Head: Normocephalic.  Cardiovascular:     Rate and Rhythm: Normal rate and regular rhythm.     Heart sounds: No murmur heard. Pulmonary:     Effort: Pulmonary effort is normal. No respiratory distress.     Breath sounds: Normal breath sounds. No wheezing or rales.  Skin:    General: Skin is warm and dry.     Capillary Refill: Capillary refill takes less than 2 seconds.  Neurological:     General: No focal deficit present.     Mental Status: She is alert and oriented to person, place, and time. Mental  status is at baseline.  Psychiatric:        Mood and Affect: Mood normal.        Behavior: Behavior normal.        Thought Content: Thought content normal.        Judgment: Judgment normal.     Results for orders placed or performed in visit on 08/25/22  [facility] Hb A1c Waived (STAT)  Result Value Ref Range   HB A1C (BAYER DCA - WAIVED) 7.7 (H) 4.8 - 5.6 %      Assessment & Plan:   Problem List Items Addressed This Visit        Cardiovascular and Mediastinum   Essential hypertension--Lisinopril - Primary    Chronic.  Controlled.  Continue with current medication regimen of Lisinopril 5mg  daily.  Encouraged patient to be consistent with medications.  Labs ordered today.  Return to clinic in 3 months for reevaluation.  Call sooner if concerns arise.        Relevant Orders   Comp Met (CMET)     Endocrine   Diabetes mellitus (HCC)    Chronic. Last A1c was 7.7% in July.  Doing well with Ozempic, Farxiga and Glipizide.  Can increase dose to Ozempic 2mg  weekly. Eye exam information brought in by patient at visit today.  Labs ordered today.  Follow up in 3 months.  Call sooner if concerns arise.       Relevant Medications   glipiZIDE (GLUCOTROL) 5 MG tablet   Other Relevant Orders   Lipid Profile   HgB A1c     Other   Morbid obesity (HCC) 260 lb    Recommended eating smaller high protein, low fat meals more frequently and exercising 30 mins a day 5 times a week with a goal of 10-15lb weight loss in the next 3 months.       Relevant Medications   glipiZIDE (GLUCOTROL) 5 MG tablet     Follow up plan: Return in about 3 months (around 02/25/2023) for Physical and Fasting labs.

## 2022-11-25 NOTE — Assessment & Plan Note (Signed)
Recommended eating smaller high protein, low fat meals more frequently and exercising 30 mins a day 5 times a week with a goal of 10-15lb weight loss in the next 3 months.  

## 2022-11-26 LAB — HEMOGLOBIN A1C
Est. average glucose Bld gHb Est-mCnc: 200 mg/dL
Hgb A1c MFr Bld: 8.6 % — ABNORMAL HIGH (ref 4.8–5.6)

## 2022-11-26 LAB — COMPREHENSIVE METABOLIC PANEL
ALT: 17 [IU]/L (ref 0–32)
AST: 11 [IU]/L (ref 0–40)
Albumin: 4 g/dL (ref 3.9–4.9)
Alkaline Phosphatase: 140 [IU]/L — ABNORMAL HIGH (ref 44–121)
BUN/Creatinine Ratio: 13 (ref 9–23)
BUN: 10 mg/dL (ref 6–24)
Bilirubin Total: 0.3 mg/dL (ref 0.0–1.2)
CO2: 22 mmol/L (ref 20–29)
Calcium: 9.4 mg/dL (ref 8.7–10.2)
Chloride: 100 mmol/L (ref 96–106)
Creatinine, Ser: 0.8 mg/dL (ref 0.57–1.00)
Globulin, Total: 2.9 g/dL (ref 1.5–4.5)
Glucose: 212 mg/dL — ABNORMAL HIGH (ref 70–99)
Potassium: 4 mmol/L (ref 3.5–5.2)
Sodium: 137 mmol/L (ref 134–144)
Total Protein: 6.9 g/dL (ref 6.0–8.5)
eGFR: 91 mL/min/{1.73_m2} (ref >59)

## 2022-11-26 LAB — LIPID PANEL
Chol/HDL Ratio: 2.2 {ratio} (ref 0.0–4.4)
Cholesterol, Total: 162 mg/dL (ref 100–199)
HDL: 73 mg/dL (ref >39)
LDL Chol Calc (NIH): 72 mg/dL (ref 0–99)
Triglycerides: 93 mg/dL (ref 0–149)
VLDL Cholesterol Cal: 17 mg/dL (ref 5–40)

## 2022-11-26 NOTE — Progress Notes (Signed)
Hi Ms. Carmen [name]. It was nice to see you yesterday.  Your lab work looks good.  Your A1c improved to 8.6%.  I recommend increasing the Ozempic to 2mg  weekly.  If you are okay with this I will send it in.   No other concerns at this time. Continue with your current medication regimen.  Follow up as discussed.  Please let me know if you have any questions.

## 2022-11-27 ENCOUNTER — Ambulatory Visit: Payer: 59 | Admitting: Anesthesiology

## 2022-11-27 ENCOUNTER — Ambulatory Visit
Admission: RE | Admit: 2022-11-27 | Discharge: 2022-11-27 | Disposition: A | Discharge status: Home / Self Care | Payer: 59 | Attending: Gastroenterology | Admitting: Gastroenterology

## 2022-11-27 ENCOUNTER — Other Ambulatory Visit: Payer: Self-pay

## 2022-11-27 ENCOUNTER — Encounter
Admission: RE | Disposition: A | Discharge status: Home / Self Care | Payer: Self-pay | Source: Home / Self Care | Attending: Gastroenterology

## 2022-11-27 ENCOUNTER — Encounter: Payer: Self-pay | Admitting: Gastroenterology

## 2022-11-27 ENCOUNTER — Encounter: Payer: Self-pay | Admitting: Nurse Practitioner

## 2022-11-27 DIAGNOSIS — Z860100 Personal history of colon polyps, unspecified: Secondary | ICD-10-CM | POA: Diagnosis not present

## 2022-11-27 DIAGNOSIS — D123 Benign neoplasm of transverse colon: Secondary | ICD-10-CM | POA: Insufficient documentation

## 2022-11-27 DIAGNOSIS — Z6841 Body Mass Index (BMI) 40.0 and over, adult: Secondary | ICD-10-CM | POA: Diagnosis not present

## 2022-11-27 DIAGNOSIS — Z7984 Long term (current) use of oral hypoglycemic drugs: Secondary | ICD-10-CM | POA: Insufficient documentation

## 2022-11-27 DIAGNOSIS — I10 Essential (primary) hypertension: Secondary | ICD-10-CM | POA: Diagnosis not present

## 2022-11-27 DIAGNOSIS — E119 Type 2 diabetes mellitus without complications: Secondary | ICD-10-CM | POA: Insufficient documentation

## 2022-11-27 DIAGNOSIS — Z7985 Long-term (current) use of injectable non-insulin antidiabetic drugs: Secondary | ICD-10-CM | POA: Insufficient documentation

## 2022-11-27 DIAGNOSIS — Z87891 Personal history of nicotine dependence: Secondary | ICD-10-CM | POA: Diagnosis not present

## 2022-11-27 DIAGNOSIS — Z1211 Encounter for screening for malignant neoplasm of colon: Secondary | ICD-10-CM | POA: Diagnosis present

## 2022-11-27 DIAGNOSIS — E1169 Type 2 diabetes mellitus with other specified complication: Secondary | ICD-10-CM

## 2022-11-27 HISTORY — PX: COLONOSCOPY WITH PROPOFOL: SHX5780

## 2022-11-27 LAB — GLUCOSE, CAPILLARY: Glucose-Capillary: 256 mg/dL — ABNORMAL HIGH (ref 70–99)

## 2022-11-27 LAB — POCT PREGNANCY, URINE: Preg Test, Ur: NEGATIVE

## 2022-11-27 SURGERY — COLONOSCOPY WITH PROPOFOL
Anesthesia: General

## 2022-11-27 MED ORDER — PROPOFOL 1000 MG/100ML IV EMUL
INTRAVENOUS | Status: AC
  Filled 2022-11-27: qty 100

## 2022-11-27 MED ORDER — PROPOFOL 10 MG/ML IV BOLUS
INTRAVENOUS | Status: DC | PRN
  Administered 2022-11-27: 100 mg via INTRAVENOUS

## 2022-11-27 MED ORDER — SODIUM CHLORIDE 0.9 % IV SOLN: INTRAVENOUS | Status: DC

## 2022-11-27 MED ORDER — PROPOFOL 500 MG/50ML IV EMUL
INTRAVENOUS | Status: DC | PRN
  Administered 2022-11-27: 150 ug/kg/min via INTRAVENOUS

## 2022-11-27 MED ORDER — LIDOCAINE HCL (CARDIAC) PF 100 MG/5ML IV SOSY
PREFILLED_SYRINGE | INTRAVENOUS | Status: DC | PRN
  Administered 2022-11-27: 40 mg via INTRAVENOUS

## 2022-11-27 MED ORDER — LIDOCAINE HCL (PF) 2 % IJ SOLN
INTRAMUSCULAR | Status: AC
  Filled 2022-11-27: qty 5

## 2022-11-27 NOTE — H&P (Signed)
Wyline Mood, MD [address], [place], [place], [address] [address], [place], [place], [address] Phone: [phone]  Fax: [phone]  Primary Care Physician:  Larae Grooms, NP   Pre-Procedure History & Physical: HPI:  Carmen [name] is a 47 y.o. female is here for an colonoscopy.   Past Medical History:  Diagnosis Date   Anxiety    NO MEDS   Depression    NO MEDS   Diabetes mellitus without complication (HCC)    High cholesterol    Hypertension    Morbid obesity (HCC)     Past Surgical History:  Procedure Laterality Date   CHOLECYSTECTOMY     COLONOSCOPY WITH PROPOFOL     COLONOSCOPY WITH PROPOFOL N/A 11/17/2017   Procedure: COLONOSCOPY WITH PROPOFOL;  Surgeon: Wyline Mood, MD;  Location: [facility] ENDOSCOPY;  Service: Gastroenterology;  Laterality: N/A;   ESOPHAGOGASTRODUODENOSCOPY (EGD) WITH PROPOFOL N/A 02/17/2017   Procedure: ESOPHAGOGASTRODUODENOSCOPY (EGD) WITH PROPOFOL;  Surgeon: Wyline Mood, MD;  Location: [facility] ENDOSCOPY;  Service: Gastroenterology;  Laterality: N/A;   ROBOTIC ASSISTED LAPAROSCOPIC CHOLECYSTECTOMY N/A 04/23/2017   Procedure: ROBOTIC ASSISTED LAPAROSCOPIC CHOLECYSTECTOMY;  Surgeon: Leafy Ro, MD;  Location: [facility] ORS;  Service: General;  Laterality: N/A;    Prior to Admission medications   Medication Sig Start Date End Date Taking? Authorizing Provider  atorvastatin (LIPITOR) 20 MG tablet TAKE 1 TABLET BY MOUTH EVERY DAY IN THE EVENING 08/25/22  Yes Mecum, Erin E, PA-C  dapagliflozin propanediol (FARXIGA) 10 MG TABS tablet Take 1 tablet (10 mg total) by mouth daily before breakfast. 08/25/22  Yes Mecum, Erin E, PA-C  glipiZIDE (GLUCOTROL) 5 MG tablet Take 1 tablet (5 mg total) by mouth 2 (two) times daily before a meal. 11/25/22  Yes Larae Grooms, NP  lisinopril (ZESTRIL) 5 MG tablet TAKE 1 TABLET BY MOUTH EVERY DAY IN THE EVENING 08/25/22  Yes Mecum, Erin E, PA-C  prochlorperazine (COMPAZINE) 10 MG tablet  Take 1 tablet (10 mg total) by mouth every 6 (six) hours as needed for nausea (headache). 07/24/22  Yes Irean Hong, MD  Semaglutide, 1 MG/DOSE, 4 MG/3ML SOPN Inject 1 mg as directed once a week. 10/02/22  Yes Mecum, Erin E, PA-C    Allergies as of 10/24/2022   (No Known Allergies)    Family History  Problem Relation Age of Onset   Diabetes Mother    Hyperlipidemia Mother    Hypertension Mother    Breast cancer Neg Hx     Social History   Socioeconomic History   Marital status: Single    Spouse name: Not on file   Number of children: Not on file   Years of education: Not on file   Highest education level: Associate degree: occupational, Scientist, product/process development, or vocational program  Occupational History   Not on file  Tobacco Use   Smoking status: Former    Current packs/day: 0.00    Average packs/day: 1.5 packs/day for 22.0 years (33.0 ttl pk-yrs)    Types: Cigarettes    Start date: 03/1998    Quit date: 03/2020    Years since quitting: 2.7   Smokeless tobacco: Never  Vaping Use   Vaping status: Never Used  Substance and Sexual Activity   Alcohol use: Not Currently    Alcohol/week: 5.0 standard drinks of alcohol    Types: 4 Shots of liquor, 1 Standard drinks or equivalent per week   Drug use: No   Sexual activity: Yes  Birth control/protection: None  Other Topics Concern   Not on file  Social History Narrative   Not on file   Social Determinants of Health   Financial Resource Strain: Low Risk  (11/21/2022)   Overall Financial Resource Strain (CARDIA)    Difficulty of Paying Living Expenses: Not hard at all  Food Insecurity: No Food Insecurity (11/21/2022)   Hunger Vital Sign    Worried About Running Out of Food in the Last Year: Never true    Ran Out of Food in the Last Year: Never true  Transportation Needs: No Transportation Needs (11/21/2022)   PRAPARE - Administrator, Civil Service (Medical): No    Lack of Transportation (Non-Medical): No  Physical  Activity: Unknown (11/21/2022)   Exercise Vital Sign    Days of Exercise per Week: Patient declined    Minutes of Exercise per Session: Not on file  Stress: No Stress Concern Present (11/21/2022)   [employer] of Occupational Health - Occupational Stress Questionnaire    Feeling of Stress : Only a little  Social Connections: Unknown (11/21/2022)   Social Connection and Isolation Panel [NHANES]    Frequency of Communication with Friends and Family: Once a week    Frequency of Social Gatherings with Friends and Family: Once a week    Attends Religious Services: Patient declined    Database administrator or Organizations: No    Attends Engineer, structural: Not on file    Marital Status: Never married  Catering manager Violence: Not on file    Review of Systems: See HPI, otherwise negative ROS  Physical Exam: BP (!) 127/93   Pulse 96   Temp (!) 97.5 F (36.4 C) (Temporal)   Ht 4\' 11"  (1.499 m)   Wt 109.8 kg   SpO2 100%   BMI 48.88 kg/m  General:   Alert,  pleasant and cooperative in NAD Head:  Normocephalic and atraumatic. Neck:  Supple; no masses or thyromegaly. Lungs:  Clear throughout to auscultation, normal respiratory effort.    Heart:  +S1, +S2, Regular rate and rhythm, No edema. Abdomen:  Soft, nontender and nondistended. Normal bowel sounds, without guarding, and without rebound.   Neurologic:  Alert and  oriented x4;  grossly normal neurologically.  Impression/Plan: [employer] is here for an colonoscopy to be performed for Screening colonoscopy average risk   Risks, benefits, limitations, and alternatives regarding  colonoscopy have been reviewed with the patient.  Questions have been answered.  All parties agreeable.   Wyline Mood, MD  11/27/2022, 9:31 AM

## 2022-11-27 NOTE — Anesthesia Postprocedure Evaluation (Signed)
Anesthesia Post Note  Patient: Armed forces logistics/support/administrative officer  Procedure(s) Performed: COLONOSCOPY WITH PROPOFOL  Patient location during evaluation: PACU Anesthesia Type: General Level of consciousness: awake and awake and alert Pain management: satisfactory to patient Vital Signs Assessment: post-procedure vital signs reviewed and stable Respiratory status: spontaneous breathing and nonlabored ventilation Cardiovascular status: stable Anesthetic complications: no   No notable events documented.   Last Vitals:  Vitals:   11/27/22 0921 11/27/22 0955  BP: (!) 127/93 107/79  Pulse: 96 (!) 106  Resp:  (!) 22  Temp: (!) 36.4 C (!) 35.7 C  SpO2: 100% 100%    Last Pain:  Vitals:   11/27/22 0955  TempSrc: Temporal  PainSc: 0-No pain                 VAN STAVEREN,Tyrell Seifer

## 2022-11-27 NOTE — Anesthesia Preprocedure Evaluation (Signed)
Anesthesia Evaluation  Patient identified by MRN, date of birth, ID band Patient awake    Reviewed: Allergy & Precautions, NPO status , Patient's Chart, lab work & pertinent test results  Airway Mallampati: II  TM Distance: >3 FB Neck ROM: full    Dental  (+) Teeth Intact   Pulmonary neg pulmonary ROS, shortness of breath and with exertion, Patient abstained from smoking., former smoker   Pulmonary exam normal  + decreased breath sounds      Cardiovascular Exercise Tolerance: Good hypertension, Pt. on medications negative cardio ROS Normal cardiovascular exam Rhythm:Regular Rate:Normal     Neuro/Psych  PSYCHIATRIC DISORDERS Anxiety Depression     Neuromuscular disease negative neurological ROS  negative psych ROS   GI/Hepatic negative GI ROS, Neg liver ROS,,,  Endo/Other  negative endocrine ROSdiabetes, Type 2, Oral Hypoglycemic Agents  Morbid obesity  Renal/GU negative Renal ROS  negative genitourinary   Musculoskeletal   Abdominal  (+) + obese  Peds negative pediatric ROS (+)  Hematology negative hematology ROS (+)   Anesthesia Other Findings Past Medical History: No date: Anxiety     Comment:  NO MEDS No date: Depression     Comment:  NO MEDS No date: Diabetes mellitus without complication (HCC) No date: High cholesterol No date: Hypertension No date: Morbid obesity (HCC)  Past Surgical History: No date: CHOLECYSTECTOMY No date: COLONOSCOPY WITH PROPOFOL 11/17/2017: COLONOSCOPY WITH PROPOFOL; N/A     Comment:  Procedure: COLONOSCOPY WITH PROPOFOL;  Surgeon: Wyline Mood, MD;  Location: [facility] ENDOSCOPY;  Service:               Gastroenterology;  Laterality: N/A; 02/17/2017: ESOPHAGOGASTRODUODENOSCOPY (EGD) WITH PROPOFOL; N/A     Comment:  Procedure: ESOPHAGOGASTRODUODENOSCOPY (EGD) WITH               PROPOFOL;  Surgeon: Wyline Mood, MD;  Location: [facility]               ENDOSCOPY;  Service:  Gastroenterology;  Laterality: N/A; 04/23/2017: ROBOTIC ASSISTED LAPAROSCOPIC CHOLECYSTECTOMY; N/A     Comment:  Procedure: ROBOTIC ASSISTED LAPAROSCOPIC               CHOLECYSTECTOMY;  Surgeon: Leafy Ro, MD;  Location:              [facility] ORS;  Service: General;  Laterality: N/A;  BMI    Body Mass Index: 48.88 kg/m      Reproductive/Obstetrics negative OB ROS                             Anesthesia Physical Anesthesia Plan  ASA: 3  Anesthesia Plan: General   Post-op Pain Management:    Induction: Intravenous  PONV Risk Score and Plan: Propofol infusion and TIVA  Airway Management Planned: Natural Airway and Nasal Cannula  Additional Equipment:   Intra-op Plan:   Post-operative Plan:   Informed Consent: I have reviewed the patients History and Physical, chart, labs and discussed the procedure including the risks, benefits and alternatives for the proposed anesthesia with the patient or authorized representative who has indicated his/her understanding and acceptance.     Dental Advisory Given  Plan Discussed with: Anesthesiologist, CRNA and Surgeon  Anesthesia Plan Comments:        Anesthesia Quick Evaluation

## 2022-11-27 NOTE — Op Note (Signed)
[facility] Gastroenterology Patient Name: Carmen [name] Procedure Date: 11/27/2022 9:03 AM MRN: [id number] Account #: [id number] Date of Birth: [birth year] Admit Type: Outpatient Age: 47 Room: [facility] ENDO ROOM 3 Gender: Female Note Status: Finalized Instrument Name: Nelda Marseille [id number] Procedure:             Colonoscopy Indications:           Surveillance: Personal history of colonic polyps                         (unknown histology) on last colonoscopy 5 years ago Providers:             Wyline Mood MD, MD Referring MD:          Larae Grooms (Referring MD) Medicines:             Monitored Anesthesia Care Complications:         No immediate complications. Procedure:             Pre-Anesthesia Assessment:                        - Prior to the procedure, a History and Physical was                         performed, and patient medications, allergies and                         sensitivities were reviewed. The patient's tolerance                         of previous anesthesia was reviewed.                        - The risks and benefits of the procedure and the                         sedation options and risks were discussed with the                         patient. All questions were answered and informed                         consent was obtained.                        - ASA Grade Assessment: II - A patient with mild                         systemic disease.                        After obtaining informed consent, the colonoscope was                         passed under direct vision. Throughout the procedure,                         the patient's blood pressure, pulse, and oxygen  saturations were monitored continuously. The                         Colonoscope was introduced through the anus and                         advanced to the the cecum, identified by the                         appendiceal orifice. The colonoscopy was performed                          with ease. The patient tolerated the procedure well.                         The quality of the bowel preparation was excellent.                         The ileocecal valve, appendiceal orifice, and rectum                         were photographed. Findings:      The perianal and digital rectal examinations were normal.      A 5 mm polyp was found in the transverse colon. The polyp was sessile.       The polyp was removed with a cold snare. Resection and retrieval were       complete.      The exam was otherwise without abnormality on direct and retroflexion       views. Impression:            - One 5 mm polyp in the transverse colon, removed with                         a cold snare. Resected and retrieved.                        - The examination was otherwise normal on direct and                         retroflexion views. Recommendation:        - Discharge patient to home (with escort).                        - Resume previous diet.                        - Continue present medications.                        - Await pathology results.                        - Repeat colonoscopy for surveillance based on                         pathology results. Procedure Code(s):     --- Professional ---                        [phone], Colonoscopy, flexible; with removal  of                         tumor(s), polyp(s), or other lesion(s) by snare                         technique Diagnosis Code(s):     --- Professional ---                        Z86.010, Personal history of colonic polyps                        D12.3, Benign neoplasm of transverse colon (hepatic                         flexure or splenic flexure) CPT copyright 2022 American Medical Association. All rights reserved. The codes documented in this report are preliminary and upon coder review may  be revised to meet current compliance requirements. Wyline Mood, MD Wyline Mood MD, MD 11/27/2022 9:53:59 AM This  report has been signed electronically. Number of Addenda: 0 Note Initiated On: 11/27/2022 9:03 AM Scope Withdrawal Time: 0 hours 10 minutes 26 seconds  Total Procedure Duration: 0 hours 12 minutes 32 seconds  Estimated Blood Loss:  Estimated blood loss: none.      [facility]

## 2022-11-27 NOTE — Transfer of Care (Signed)
Immediate Anesthesia Transfer of Care Note  Patient: Armed forces logistics/support/administrative officer  Procedure(s) Performed: Procedure(s): COLONOSCOPY WITH PROPOFOL (N/A)  Patient Location: PACU and Endoscopy Unit  Anesthesia Type:General  Level of Consciousness: sedated  Airway & Oxygen Therapy: Patient Spontanous Breathing and Patient connected to nasal cannula oxygen  Post-op Assessment: Report given to RN and Post -op Vital signs reviewed and stable  Post vital signs: Reviewed and stable  Last Vitals:  Vitals:   11/27/22 0921 11/27/22 0955  BP: (!) 127/93 107/79  Pulse: 96 (!) 106  Resp:  (!) 22  Temp: (!) 36.4 C (!) 35.7 C  SpO2: 100% 100%    Complications: No apparent anesthesia complications

## 2022-11-28 ENCOUNTER — Encounter: Payer: Self-pay | Admitting: Gastroenterology

## 2022-11-28 LAB — SURGICAL PATHOLOGY

## 2022-11-28 MED ORDER — SEMAGLUTIDE (2 MG/DOSE) 8 MG/3ML ~~LOC~~ SOPN: 2.0000 mg | PEN_INJECTOR | SUBCUTANEOUS | 1 refills | Status: DC

## 2022-11-30 NOTE — Plan of Care (Signed)
[facility] Tonsillectomy/Adenoidectomy, Postoperative PEDS care plan entered in error.

## 2022-12-02 ENCOUNTER — Encounter: Payer: Self-pay | Admitting: Gastroenterology

## 2022-12-28 ENCOUNTER — Encounter: Payer: Self-pay | Admitting: Nurse Practitioner

## 2022-12-30 ENCOUNTER — Encounter: Payer: Self-pay | Admitting: Nurse Practitioner

## 2022-12-30 NOTE — Telephone Encounter (Signed)
Addressed in previous message from 12/28/2022.

## 2022-12-30 NOTE — Telephone Encounter (Signed)
Patient states that [facility] never received the rx that was sent on 11/28/22. Patient is requesting that rx be resent.

## 2023-02-25 ENCOUNTER — Encounter: Payer: 59 | Admitting: Nurse Practitioner

## 2023-03-02 ENCOUNTER — Encounter: Payer: Self-pay | Admitting: Nurse Practitioner

## 2023-03-02 ENCOUNTER — Ambulatory Visit: Payer: 59 | Admitting: Nurse Practitioner

## 2023-03-02 VITALS — BP 134/86 | HR 94 | Temp 98.4°F | Ht 59.0 in | Wt 242.4 lb

## 2023-03-02 DIAGNOSIS — Z7985 Long-term (current) use of injectable non-insulin antidiabetic drugs: Secondary | ICD-10-CM | POA: Diagnosis not present

## 2023-03-02 DIAGNOSIS — I10 Essential (primary) hypertension: Secondary | ICD-10-CM | POA: Diagnosis not present

## 2023-03-02 DIAGNOSIS — E1169 Type 2 diabetes mellitus with other specified complication: Secondary | ICD-10-CM

## 2023-03-02 LAB — MICROALBUMIN, URINE WAIVED
Creatinine, Urine Waived: 200 mg/dL (ref 10–300)
Microalb, Ur Waived: 30 mg/L — ABNORMAL HIGH (ref 0–19)
Microalb/Creat Ratio: <30 mg/g (ref <30)

## 2023-03-02 MED ORDER — GLIPIZIDE 5 MG PO TABS: 5.0000 mg | ORAL_TABLET | Freq: Two times a day (BID) | ORAL | 1 refills | Status: DC

## 2023-03-02 MED ORDER — DAPAGLIFLOZIN PROPANEDIOL 10 MG PO TABS: 10.0000 mg | ORAL_TABLET | Freq: Every day | ORAL | 1 refills | Status: DC

## 2023-03-02 MED ORDER — ATORVASTATIN CALCIUM 20 MG PO TABS
ORAL_TABLET | ORAL | 1 refills | Status: DC
Start: 2023-03-02 — End: 2023-03-04

## 2023-03-02 MED ORDER — LISINOPRIL 5 MG PO TABS
ORAL_TABLET | ORAL | 1 refills | Status: DC
Start: 2023-03-02 — End: 2023-03-04

## 2023-03-02 MED ORDER — SEMAGLUTIDE (2 MG/DOSE) 8 MG/3ML ~~LOC~~ SOPN: 2.0000 mg | PEN_INJECTOR | SUBCUTANEOUS | 1 refills | Status: DC

## 2023-03-02 NOTE — Assessment & Plan Note (Signed)
Referral placed for Bariatric surgery at patient's request.

## 2023-03-02 NOTE — Progress Notes (Signed)
BP 134/86   Pulse 94   Temp 98.4 F (36.9 C) (Oral)   Ht 4\' 11"  (1.499 m)   Wt 242 lb 6.4 oz (110 kg)   SpO2 100%   BMI 48.96 kg/m    Subjective:    Patient ID: Carmen [name] Carmen [name] [name], female    DOB: April 04, 1975, 48 y.o.   MRN: [id number]  HPI: Carmen [name] Carmen [name] [name] is a 48 y.o. female   Chief Complaint  Patient presents with   3 month follow up   Sinus Problem    Has been a problem for awhile    Referral    ENT and Dermatology, bariatric surgery    HYPERTENSION / HYPERLIPIDEMIA Satisfied with current treatment? yes Duration of hypertension: years BP monitoring frequency: not checking BP range:  BP medication side effects: no  Past BP meds: lisinopril Duration of hyperlipidemia: years Cholesterol medication side effects: no Cholesterol supplements: none Past cholesterol medications: atorvastain (lipitor) Medication compliance: excellent compliance  Aspirin: no Recent stressors: no  Recurrent headaches: no  Visual changes: no Palpitations: no Dyspnea: no Chest pain: no Lower extremity edema: no  Dizzy/lightheaded: no  DIABETES Has been consistent with Ozempic and Glipizide.  She hasn't had the [place] in [place]. Hypoglycemic episodes: no Polydipsia/polyuria: no Visual disturbance: no Chest pain: no Paresthesias: no Glucose Monitoring: no  Accucheck frequency: Not Checking  Fasting glucose:  Post prandial:  Evening:  Before meals: Taking Insulin?: no  Long acting insulin:  Short acting insulin: Blood Pressure Monitoring: not checking Retinal Examination: Up to date;  Foot Exam: up to date Diabetic Education: Completed Pneumovax: up to date Influenza: up to date Aspirin: no  Patient states she has continuous drainage.  It is clear drainage.  She has been having a little cough and sore throat.  She is not currently taking any antihistamines.     She would like to discuss bariatric surgery.  Has been with her company two years and they will cover  her bariatric surgery now.    Relevant past medical, surgical, family and social history reviewed and updated as indicated. Interim medical history since our last visit reviewed. Allergies and medications reviewed and updated.  Review of Systems  Constitutional:  Negative for activity change, appetite change, chills and fatigue.  Eyes:  Negative for visual disturbance.  Respiratory:  Negative for cough, chest tightness and shortness of breath.   Cardiovascular:  Negative for chest pain, palpitations and leg swelling.  Gastrointestinal:  Negative for constipation, diarrhea, nausea and vomiting.  Endocrine: Negative for polydipsia and polyuria.  Genitourinary:  Negative for difficulty urinating and dysuria.  Neurological:  Negative for dizziness, weakness, light-headedness, numbness and headaches.    Per HPI unless specifically indicated above     Objective:    BP 134/86   Pulse 94   Temp 98.4 F (36.9 C) (Oral)   Ht 4\' 11"  (1.499 m)   Wt 242 lb 6.4 oz (110 kg)   SpO2 100%   BMI 48.96 kg/m   Wt Readings from Last 3 Encounters:  03/02/23 242 lb 6.4 oz (110 kg)  11/27/22 242 lb (109.8 kg)  11/25/22 248 lb 3.2 oz (112.6 kg)    Physical Exam Vitals and nursing note reviewed.  Constitutional:      General: She is not in acute distress.    Appearance: Normal appearance. She is normal weight. She is not ill-appearing, toxic-appearing or diaphoretic.  HENT:     Head: Normocephalic.     Right Ear: External ear  normal.     Left Ear: External ear normal.     Nose: Nose normal.     Mouth/Throat:     Mouth: Mucous membranes are moist.     Pharynx: Oropharynx is clear.  Eyes:     General:        Right eye: No discharge.        Left eye: No discharge.     Extraocular Movements: Extraocular movements intact.     Conjunctiva/sclera: Conjunctivae normal.     Pupils: Pupils are equal, round, and reactive to light.  Cardiovascular:     Rate and Rhythm: Normal rate and regular  rhythm.     Heart sounds: No murmur heard. Pulmonary:     Effort: Pulmonary effort is normal. No respiratory distress.     Breath sounds: Normal breath sounds. No wheezing or rales.  Musculoskeletal:     Cervical back: Normal range of motion and neck supple.  Skin:    General: Skin is warm and dry.     Capillary Refill: Capillary refill takes less than 2 seconds.  Neurological:     General: No focal deficit present.     Mental Status: She is alert and oriented to person, place, and time. Mental status is at baseline.  Psychiatric:        Mood and Affect: Mood normal.        Behavior: Behavior normal.        Thought Content: Thought content normal.        Judgment: Judgment normal.     Results for orders placed or performed during the hospital encounter of 11/27/22  Surgical pathology   Collection Time: 11/27/22 12:00 AM  Result Value Ref Range   SURGICAL PATHOLOGY      SURGICAL PATHOLOGY [facility] [address] [place], [place] [address] Telephone [phone] or [phone] Fax [phone]  REPORT OF SURGICAL PATHOLOGY   Accession #: [id number] Patient Name: Carmen [name], Carmen [name] [name] Visit # : [id number]  MRN: [id number] Physician: Wyline Mood DOB/Age March 11, 1975 (Age: 44) Gender: F Collected Date: 11/27/2022 Received Date: 11/27/2022  FINAL DIAGNOSIS       1. Transverse Colon Polyp, Cold snare :       - TUBULAR ADENOMA.      - NEGATIVE FOR HIGH-GRADE DYSPLASIA AND MALIGNANCY.       DATE SIGNED OUT: 11/28/2022 ELECTRONIC SIGNATURE : Oneita Kras Md, Delice Bison , Pathologist, Electronic Signature  MICROSCOPIC DESCRIPTION  CASE COMMENTS STAINS USED IN DIAGNOSIS: H&E    CLINICAL HISTORY  SPECIMEN(S) OBTAINED 1. Transverse Colon Polyp, Cold Snare  SPECIMEN COMMENTS: SPECIMEN CLINICAL INFORMATION: 1. History of colon polyps.  Polyps    Gross Description 1. "Cold snare transverse colon poly p", received in formalin is a single 0.3  x 0.3 x 0.1 cm pale tan tissue fragment. The specimen is submitted in toto in 1 block (1A).      [facility] 11/27/2022        Report signed out from the following location(s) . [facility] [address], [place] [address] CLIA #: [id number]  [facility] [address] [place], [place] [address] CLIA #: [id number]   Pregnancy, urine POC   Collection Time: 11/27/22  9:11 AM  Result Value Ref Range   Preg Test, Ur NEGATIVE NEGATIVE  Glucose, capillary   Collection Time: 11/27/22  9:24 AM  Result Value Ref Range   Glucose-Capillary 256 (H) 70 - 99 mg/dL  Assessment & Plan:   Problem List Items Addressed This Visit       Cardiovascular and Mediastinum   Essential hypertension--Lisinopril - Primary   Chronic.  Controlled.  Continue with current medication regimen of Lisinopril 5mg  daily.  Labs ordered today.  Return to clinic in 3 months for reevaluation.  Call sooner if concerns arise.       Relevant Medications   atorvastatin (LIPITOR) 20 MG tablet   lisinopril (ZESTRIL) 5 MG tablet   Other Relevant Orders   Hemoglobin A1c     Endocrine   Diabetes mellitus (HCC)   Chronic. Last A1c was 8.6% in July.  Doing well with Ozempic, Farxiga and Glipizide.  Has not been on Farxiga.  New prescription sent into the pharmacy. Patient will bring in updated eye exam information.  Labs ordered today.  Follow up in 3 months.  Call sooner if concerns arise.       Relevant Medications   glipiZIDE (GLUCOTROL) 5 MG tablet   Semaglutide, 2 MG/DOSE, 8 MG/3ML SOPN   atorvastatin (LIPITOR) 20 MG tablet   dapagliflozin propanediol (FARXIGA) 10 MG TABS tablet   lisinopril (ZESTRIL) 5 MG tablet   Other Relevant Orders   Hemoglobin A1c   Comprehensive metabolic panel   Microalbumin, Urine Waived     Other   Morbid obesity (HCC) 260 lb   Referral placed for Bariatric surgery at patient's request.      Relevant Medications   glipiZIDE (GLUCOTROL)  5 MG tablet   Semaglutide, 2 MG/DOSE, 8 MG/3ML SOPN   dapagliflozin propanediol (FARXIGA) 10 MG TABS tablet   Other Relevant Orders   Amb Referral to Bariatric Surgery      Follow up plan: Return in about 3 months (around 05/31/2023) for HTN, HLD, DM2 FU.

## 2023-03-02 NOTE — Assessment & Plan Note (Signed)
Chronic.  Controlled.  Continue with current medication regimen of Lisinopril 5mg  daily.  Labs ordered today.  Return to clinic in 3 months for reevaluation.  Call sooner if concerns arise.

## 2023-03-02 NOTE — Assessment & Plan Note (Signed)
Chronic. Last A1c was 8.6% in July.  Doing well with Ozempic, Farxiga and Glipizide.  Has not been on Farxiga.  New prescription sent into the pharmacy. Patient will bring in updated eye exam information.  Labs ordered today.  Follow up in 3 months.  Call sooner if concerns arise.

## 2023-03-04 ENCOUNTER — Encounter: Payer: Self-pay | Admitting: Nurse Practitioner

## 2023-03-04 DIAGNOSIS — I10 Essential (primary) hypertension: Secondary | ICD-10-CM

## 2023-03-04 DIAGNOSIS — E1169 Type 2 diabetes mellitus with other specified complication: Secondary | ICD-10-CM

## 2023-03-04 MED ORDER — SEMAGLUTIDE (2 MG/DOSE) 8 MG/3ML ~~LOC~~ SOPN: 2.0000 mg | PEN_INJECTOR | SUBCUTANEOUS | 1 refills | Status: DC

## 2023-03-04 MED ORDER — ATORVASTATIN CALCIUM 20 MG PO TABS
ORAL_TABLET | ORAL | 1 refills | Status: DC
Start: 2023-03-04 — End: 2023-09-07

## 2023-03-04 MED ORDER — DAPAGLIFLOZIN PROPANEDIOL 10 MG PO TABS
10.0000 mg | ORAL_TABLET | Freq: Every day | ORAL | 1 refills | Status: DC
Start: 2023-03-04 — End: 2023-09-07

## 2023-03-04 MED ORDER — GLIPIZIDE 5 MG PO TABS: 5.0000 mg | ORAL_TABLET | Freq: Two times a day (BID) | ORAL | 1 refills | Status: DC

## 2023-03-04 MED ORDER — LISINOPRIL 5 MG PO TABS
ORAL_TABLET | ORAL | 1 refills | Status: DC
Start: 2023-03-04 — End: 2023-06-04

## 2023-03-11 ENCOUNTER — Other Ambulatory Visit: Payer: 59

## 2023-03-11 DIAGNOSIS — I10 Essential (primary) hypertension: Secondary | ICD-10-CM

## 2023-03-11 DIAGNOSIS — E1169 Type 2 diabetes mellitus with other specified complication: Secondary | ICD-10-CM

## 2023-03-12 ENCOUNTER — Encounter: Payer: Self-pay | Admitting: Nurse Practitioner

## 2023-03-12 ENCOUNTER — Telehealth: Payer: Self-pay | Admitting: Nurse Practitioner

## 2023-03-12 LAB — COMPREHENSIVE METABOLIC PANEL
ALT: 28 [IU]/L (ref 0–32)
AST: 14 [IU]/L (ref 0–40)
Albumin: 4 g/dL (ref 3.9–4.9)
Alkaline Phosphatase: 151 [IU]/L — ABNORMAL HIGH (ref 44–121)
BUN/Creatinine Ratio: 13 (ref 9–23)
BUN: 12 mg/dL (ref 6–24)
Bilirubin Total: 0.4 mg/dL (ref 0.0–1.2)
CO2: 24 mmol/L (ref 20–29)
Calcium: 9.6 mg/dL (ref 8.7–10.2)
Chloride: 99 mmol/L (ref 96–106)
Creatinine, Ser: 0.95 mg/dL (ref 0.57–1.00)
Globulin, Total: 2.8 g/dL (ref 1.5–4.5)
Glucose: 317 mg/dL — ABNORMAL HIGH (ref 70–99)
Potassium: 4.3 mmol/L (ref 3.5–5.2)
Sodium: 138 mmol/L (ref 134–144)
Total Protein: 6.8 g/dL (ref 6.0–8.5)
eGFR: 74 mL/min/{1.73_m2} (ref >59)

## 2023-03-12 LAB — HEMOGLOBIN A1C
Est. average glucose Bld gHb Est-mCnc: 194 mg/dL
Hgb A1c MFr Bld: 8.4 % — ABNORMAL HIGH (ref 4.8–5.6)

## 2023-03-12 NOTE — Telephone Encounter (Signed)
Patient's eye exam abstracted into chart. Placed report in providers folder for review and then to scan.

## 2023-03-12 NOTE — Telephone Encounter (Signed)
Patient brought by Eye exam for provider to review and have scanned in her chart. I am placing forms in providers folder.

## 2023-03-13 ENCOUNTER — Telehealth: Payer: Self-pay

## 2023-03-13 NOTE — Progress Notes (Signed)
Care Guide Pharmacy Note  03/13/2023 Name: Rhealynn Myhre MRN: [id number] DOB: [birth year]  Referred By: Larae Grooms, NP Reason for referral: Care Coordination (Outreach to schedule with Pharm d )   Mishika Flippen is a 48 y.o. year old female who is a primary care patient of Larae Grooms, NP.  [employer] was referred to the pharmacist for assistance related to: DMII  Successful contact was made with the patient to discuss pharmacy services including being ready for the pharmacist to call at least 5 minutes before the scheduled appointment time and to have medication bottles and any blood pressure readings ready for review. The patient agreed to meet with the pharmacist via telephone visit on (date/time).03/19/2023  Penne Lash , RMA     [place]  [facility], [facility] Guide  Direct Dial: [phone]  Website: [email or web address]

## 2023-03-19 ENCOUNTER — Encounter: Payer: Self-pay | Admitting: Nurse Practitioner

## 2023-03-19 ENCOUNTER — Other Ambulatory Visit: Payer: Self-pay

## 2023-03-19 MED ORDER — FREESTYLE LANCETS MISC
12 refills | Status: DC
Start: 2023-03-19 — End: 2023-03-19

## 2023-03-19 MED ORDER — FREESTYLE LITE TEST VI STRP: ORAL_STRIP | 12 refills | Status: DC

## 2023-03-19 MED ORDER — FREESTYLE LITE W/DEVICE KIT: PACK | 0 refills | Status: DC

## 2023-03-19 NOTE — Addendum Note (Signed)
 Addended by: Keane Passe A on: 03/19/2023 12:37 PM   Modules accepted: Orders

## 2023-03-19 NOTE — Progress Notes (Signed)
   03/19/2023  Patient ID: Carmen [name], female   DOB: [birth year], 48 y.o.   MRN: [id number]  Prior authorization requests for Farxiga  10 mg daily and Mounjaro  10 mg weekly came back stating prior authorizations are not required.  Obtained a co-pay card for both medications that I we will provide to the pharmacy to assist with patient's out-of-pocket cost.  Farxiga  co-pay card: [id number] PCN-CN [id number]  Mounjaro  co-pay card RXBIN: [id number] PCN: [id number] GRP: [id number] ID: [id number] Expiration Date: 02/10/2024  [name], PharmD, DPLA

## 2023-03-19 NOTE — Progress Notes (Signed)
 03/19/2023 Name: Carmen [name] MRN: [id number] DOB: 1975-04-14  Chief Complaint  Patient presents with   Medication Assistance   Carmen [name] is a 48 y.o. year old female who presented for a telephone visit.   They were referred to the pharmacist by their PCP for assistance in managing diabetes.   Subjective:  Care Team: Primary Care Provider: Melvin Pao, NP ; Next Scheduled Visit: 06/04/23  Medication Access/Adherence  Current Pharmacy:  [id number] [place], [place] - [address] [place] [address] Phone: [phone] Fax: [phone]  [facility] - [name], [place] - [address] [place] [address] Phone: [phone] Fax: [phone]  [facility] [phone] [place], [place] - [address] AT [facility] [address] [place] [address] Phone: [phone] Fax: [phone]  -Patient reports affordability concerns with their medications: No  -Patient reports access/transportation concerns to their pharmacy: No  -Patient reports adherence concerns with their medications:  Yes    Diabetes: Current medications: Ozempic  2 mg weekly, glipizide  5 mg tablet twice daily before a meal -Patient is also prescribed Farxiga  10 mg daily, but she reports she has not had this filled in quite some time and is unsure why -Primary care provider would like to switch patient to Mounjaro , because patient has experienced increased alk phos while on Ozempic .  Would also like to achieve additional weight loss and decrease in A1c. -Patient did not take Ozempic  dose that was due yesterday, because of the recent constipation.  Patient has been on Ozempic  2 mg dose a few months and has just now been experiencing constipation.  She does endorse relief with OTC stool softener and/or laxative. -Patient is in need of new glucometer-she was using freestyle lite.  She would prefer CGM if  covered by her insurance plan. -Most recent A1c 8.4%  Objective: Lab Results  Component Value Date   HGBA1C 8.4 (H) 03/11/2023   Lab Results  Component Value Date   CREATININE 0.95 03/11/2023   BUN 12 03/11/2023   NA 138 03/11/2023   K 4.3 03/11/2023   CL 99 03/11/2023   CO2 24 03/11/2023   Medications Reviewed Today     Reviewed by Carmen [name] LABOR, RPH (Pharmacist) on 03/19/23 at 0910  Med List Status: <None>   Medication Order Taking? Sig Documenting Provider Last Dose Status Informant  atorvastatin  (LIPITOR) 20 MG tablet [id number] Yes TAKE 1 TABLET BY MOUTH EVERY DAY IN THE KARNA Carmen Pao, NP Taking Active   dapagliflozin  propanediol (FARXIGA ) 10 MG TABS tablet [id number] No Take 1 tablet (10 mg total) by mouth daily before breakfast.  Patient not taking: Reported on 03/19/2023   Carmen Pao, NP Not Taking Active   glipiZIDE  (GLUCOTROL ) 5 MG tablet [id number] Yes Take 1 tablet (5 mg total) by mouth 2 (two) times daily before a meal. Carmen Pao, NP Taking Active   lisinopril  (ZESTRIL ) 5 MG tablet [id number] Yes TAKE 1 TABLET BY MOUTH EVERY DAY IN THE KARNA Carmen Pao, NP Taking Active   prochlorperazine  (COMPAZINE ) 10 MG tablet [id number]  Take 1 tablet (10 mg total) by mouth every 6 (six) hours as needed for nausea (headache). [name], Carmen J, MD  Active   Semaglutide , 2 MG/DOSE, 8 MG/3ML SOPN [id number] No Inject 2 mg as directed once a week.  Patient not taking: Reported on 03/19/2023   Carmen Pao, NP  Not Taking Active            Assessment/Plan:   Diabetes: -Currently uncontrolled with A1c >7% -Contacted patient's insurance, and they state Mounjaro  is on formulary but does require a prior authorization.  I am submitting a prior authorization request for Mounjaro  10 mg weekly on line via cover my meds. -Farxiga  is also covered by the plan, but this medication also requires a prior authorization-I am submitting a prior authorization request  online via cover my meds for this as well. -Both Mounjaro  and Farxiga  would be $50 co-pay for 1 month; so I will also obtain co-pay cards that should take Mounjaro  down to $25 a month and Farxiga  down to $5 a month -Johnson controls 3 or Dexcom G7 would be covered with a prior authorization; but sensors would be $50 for a 1 month supply or $130 for a 76-month supply -Freestyle lite meter is covered at a $14 co-pay, test strips $15-$25 co-pay depending on the day supply, and lancets a $7 co-pay -Sending patient a message via MyChart to inform that I am working on prior authorizations for Mounjaro  and Farxiga .  Also getting preference for CGM versus testing supplies based on co-pay.  Follow Up Plan: Will monitor progress of prior authorizations and schedule follow-up appropriately  [name] Carmen [name], PharmD, DPLA

## 2023-03-19 NOTE — Addendum Note (Signed)
 Addended by: Keane Passe A on: 03/19/2023 06:20 PM   Modules accepted: Orders

## 2023-03-19 NOTE — Addendum Note (Signed)
 Addended by: Keane Passe A on: 03/19/2023 01:00 PM   Modules accepted: Orders

## 2023-03-19 NOTE — Addendum Note (Signed)
 Addended by: Aileen Alexanders on: 03/19/2023 01:01 PM   Modules accepted: Orders

## 2023-03-19 NOTE — Addendum Note (Signed)
 Addended by: Keane Passe A on: 03/19/2023 05:57 PM   Modules accepted: Orders

## 2023-03-20 ENCOUNTER — Telehealth: Payer: Self-pay

## 2023-03-20 MED ORDER — FREESTYLE LITE TEST VI STRP: ORAL_STRIP | 12 refills | Status: AC

## 2023-03-20 MED ORDER — TIRZEPATIDE 10 MG/0.5ML ~~LOC~~ SOAJ
10.0000 mg | SUBCUTANEOUS | 1 refills | Status: DC
Start: 2023-03-20 — End: 2023-05-29

## 2023-03-20 MED ORDER — FREESTYLE LITE W/DEVICE KIT: PACK | 0 refills | Status: AC

## 2023-03-20 MED ORDER — FREESTYLE LANCETS MISC
12 refills | Status: AC
Start: 2023-03-20 — End: ?

## 2023-03-20 NOTE — Progress Notes (Signed)
   03/20/2023  Patient ID: Carmen [name], female   DOB: 1975/07/02, 48 y.o.   MRN: [id number]  Contacted [facility] pharmacy to check on coverage/copay for Farxiga  10mg  daily and Mounjaro  10mg  weekly.  Mounjaro  is ready for pick up $25, but insurance IS requiring a prior authorization for Farxiga .  I have submitted a PA request for Farxiga  online via CoverMyMeds and will follow-up on status next week.  [name], PharmD, DPLA

## 2023-03-25 ENCOUNTER — Telehealth: Payer: Self-pay

## 2023-03-25 NOTE — Progress Notes (Signed)
   03/25/2023  Patient ID: Carmen [name], female   DOB: 1975-08-22, 48 y.o.   MRN: [id number]  [facility] prior authorization department to follow-up on Farxiga 10 mg prior authorization submitted to the insurance company on 2/10.  Representative states that the request is still under review and could take several more days for a response to come through.  I will continue to monitor progress through cover my meds website.  Carmen [name], PharmD, DPLA

## 2023-03-26 ENCOUNTER — Other Ambulatory Visit: Payer: Self-pay | Admitting: Nurse Practitioner

## 2023-03-26 NOTE — Telephone Encounter (Signed)
Unable to refill per protocol, Rx expired. Discontinued 03/02/23.  Requested Prescriptions  Pending Prescriptions Disp Refills   OZEMPIC, 2 MG/DOSE, 8 MG/3ML SOPN [Pharmacy Med Name: OZEMPIC 8 MG/3 ML (2 MG/DOSE)]  3    Sig: INJECT 2 MG AS DIRECTED ONCE A WEEK.     Endocrinology:  Diabetes - GLP-1 Receptor Agonists - semaglutide Failed - 03/26/2023  1:55 PM      Failed - HBA1C in normal range and within 180 days    HB A1C (BAYER DCA - WAIVED)  Date Value Ref Range Status  08/25/2022 7.7 (H) 4.8 - 5.6 % Final    Comment:             Prediabetes: 5.7 - 6.4          Diabetes: >6.4          Glycemic control for adults with diabetes: <7.0    Hgb A1c MFr Bld  Date Value Ref Range Status  03/11/2023 8.4 (H) 4.8 - 5.6 % Final    Comment:             Prediabetes: 5.7 - 6.4          Diabetes: >6.4          Glycemic control for adults with diabetes: <7.0          Passed - Cr in normal range and within 360 days    Creatinine, Ser  Date Value Ref Range Status  03/11/2023 0.95 0.57 - 1.00 mg/dL Final         Passed - Valid encounter within last 6 months    Recent Outpatient Visits           3 weeks ago Essential hypertension--Lisinopril   [place] [facility] Larae Grooms, NP   4 months ago Essential hypertension--Lisinopril   [place] [facility] Larae Grooms, NP   7 months ago Type 2 diabetes mellitus with other specified complication, without long-term current use of insulin (HCC)   [place] [facility] Mecum, Oswaldo Conroy, PA-C   10 months ago Annual physical exam   [place] [facility] Larae Grooms, NP   11 months ago COVID-19   [facility] Larae Grooms, NP       Future Appointments             In 2 months Larae Grooms, NP [place] [facility], [facility]

## 2023-04-01 NOTE — Progress Notes (Signed)
   04/01/2023  Patient ID: Carmen [name], female   DOB: [birth year], 48 y.o.   MRN: [id number]  Prior authorization for Marcelline Deist came back denied stating insurance preferred brand name, which PA appeared to be submitted for on CoverMyMeds.  Contacted [facility], and they were able to complete insurance approval for Farxiga 10mg  daily over the phone.  Contacted patient's pharmacy to have them process claim, and the medication is now going through for $0.  Sending patient a MyChart message to make her aware.  Lenna Gilford, PharmD, DPLA

## 2023-04-16 ENCOUNTER — Other Ambulatory Visit: Payer: Self-pay

## 2023-04-16 NOTE — Progress Notes (Signed)
   04/16/2023  Patient ID: Carmen [name], female   DOB: [birth year], 48 y.o.   MRN: [id number]  Subjective/objective Telephone visit to follow-up on management of type 2 diabetes  Diabetes management plan -Current medications: Glipizide 5 mg twice daily before meals, Mounjaro 10 mg weekly -Patient has not yet picked up and resumed Farxiga 10 mg daily -Not monitoring home blood glucose on a regular basis, because she endorses trouble getting enough blood for adequate sample on test strips -Patient recently switched back to Mounjaro 10 mg weekly from Ozempic-endorses tolerating this medication and dose well with no adverse side effects -Most recent A1c was elevated at 8.4%  Assessment/plan  Diabetes management plan -Patient plans to pick up and initiate Farxiga 10 mg daily-continue glipizide 5 mg twice daily and Mounjaro 10 mg weekly -Suggested patient dangle hand for a minute or 2 prior to checking blood glucose and also instructed how to push tip of finger firmly against lancing device -I recommended that patient check fasting blood glucose every other day and 2-hour postprandial every other day in order to not have to have multiple fingersticks daily -Patient sees primary care provider again on 4/24  Follow-up: 4 weeks  Lenna Gilford, PharmD, DPLA

## 2023-05-06 ENCOUNTER — Encounter: Payer: Self-pay | Admitting: Nurse Practitioner

## 2023-05-14 ENCOUNTER — Other Ambulatory Visit: Payer: Self-pay

## 2023-05-14 MED ORDER — OMRON 3 SERIES BP MONITOR DEVI: 0 refills | Status: AC

## 2023-05-14 NOTE — Progress Notes (Signed)
   05/14/2023  Patient ID: Carmen [name], female   DOB: Nov 25, 1975, 48 y.o.   MRN: [id number]  Subjective/Objective Patient outreach to follow up on management of diabetes and hypertension  Diabetes Management -Current medications:  Mounjaro 10mg  weekly, Marcelline Deist 10mg  daily, glipizide 5mg  BID before a meal -Patient still unable to monitor home BG- getting error reading on monitor -Has not yet started Farxiga 10mg , but plans to pick up from pharmacy at start this week -A1c in January elevated at 8.4%  Hypertension -Current medications:  lisinopril 5mg  daily -Does not monitor home BP due to not having monitor -Last OV reading slightly elevated at 134/86  Assessment/Plan  Diabetes Management -Initiate Farxiga 10mg  daily, continue Mounjaro 10mg  weekly and glipizide 5mg  BID -Patient coming in to see me 4/9, so I can assist with testing supplies  Hypertension -Continue current regimen at this time -Sending BP monitor prescription in to patient's pharmacy to see if insurance will cover- if not, I may be able to supply at upcoming visit  Follow-up:  4/9 at [facility]  Lenna Gilford, PharmD, DPLA

## 2023-05-18 ENCOUNTER — Telehealth: Payer: Self-pay

## 2023-05-18 NOTE — Progress Notes (Signed)
   05/18/2023  Patient ID: Carmen [name], female   DOB: 1975/05/21, 48 y.o.   MRN: [id number]  [facility], and insurance will not cover BP monitor.  Will discuss affordability of purchasing OTC when I see the patient in person for our visit Wednesday.  If unable to afford, I will supply under [facility] standing order/supply.  Lenna Gilford, PharmD, DPLA

## 2023-05-20 ENCOUNTER — Other Ambulatory Visit: Payer: Self-pay

## 2023-05-20 NOTE — Progress Notes (Signed)
   05/20/2023  Patient ID: Carmen [name], female   DOB: Nov 02, 1975, 48 y.o.   MRN: [id number]  Subjective/Objective Patient presenting at [facility] for assistance with BG monitor   Diabetes Management -Current medications:  Mounjaro 10mg  weekly, Farxiga 10mg  daily, glipizide 5mg  BID before a meal -Patient brought Freestyle Lite Glucometer(s), test strips, and lancets- education provided on how to use (applying blood to strip).  BG in office (post-prandial) 264 today. -Patient does endorse picking up and starting Farxiga 10mg  daily -A1c in January elevated at 8.4%   Hypertension -Current medications:  lisinopril 5mg  daily -Insurance did not cover BP monitor, so provided 1 to patient under [facility] standing order -Last OV reading slightly elevated at 134/86   Assessment/Plan   Diabetes Management -Continue current regimen at this time -Monitor and record FBG daily -Patient sees PCP again 4/24 and due for A1c.  Based on A1c and tolerance of Mounjaro 10mg , could consider increasing to 12.5mg  weekly and decreasing or stopping glipizide   Hypertension -Continue current regimen at this time -Educated on proper blood pressure monitoring technique- recommend to monitor/record daily 1 hour after taking BP medication -If pressures consistently >130/80, I recommend increasing lisinopril to 10mg  daily    Follow-up:  5/28  Lenna Gilford, PharmD, DPLA

## 2023-05-26 ENCOUNTER — Other Ambulatory Visit (HOSPITAL_COMMUNITY): Payer: Self-pay | Admitting: General Surgery

## 2023-05-26 DIAGNOSIS — Z794 Long term (current) use of insulin: Secondary | ICD-10-CM

## 2023-05-27 ENCOUNTER — Other Ambulatory Visit: Payer: Self-pay | Admitting: Nurse Practitioner

## 2023-05-28 ENCOUNTER — Other Ambulatory Visit: Payer: Self-pay | Admitting: Nurse Practitioner

## 2023-05-28 ENCOUNTER — Ambulatory Visit (HOSPITAL_COMMUNITY)

## 2023-05-28 NOTE — Telephone Encounter (Signed)
 Requested medication (s) are due for refill today: yes  Requested medication (s) are on the active medication list: yes  Last refill:  03/20/23 #6ml/1  Future visit scheduled: yes  Notes to clinic:  Unable to refill per protocol, medication not assigned to the refill protocol.      Requested Prescriptions  Pending Prescriptions Disp Refills   MOUNJARO 10 MG/0.5ML Pen [Pharmacy Med Name: MOUNJARO 10MG /0.5ML INJ (4 PENS)] 2 mL 1    Sig: INJECT 10 MG UNDER THE SKIN ONE DAY A WEEK     Off-Protocol Failed - 05/28/2023  5:34 PM      Failed - Medication not assigned to a protocol, review manually.      Failed - Valid encounter within last 12 months    Recent Outpatient Visits   None     Future Appointments             In 1 week Aileen Alexanders, NP [place] [facility], [facility]

## 2023-05-29 NOTE — Telephone Encounter (Signed)
 Requested Prescriptions  Refused Prescriptions Disp Refills   Blood Glucose Monitoring Suppl (FREESTYLE LITE) w/Device KIT [Pharmacy Med Name: FREESTYLE LITE BLOOD GLUCOSE SYSTEM]      Sig: USE TO CHECK BLOOD GLUCOSE UP TO THREE TIMES DAILY     Endocrinology: Diabetes - Testing Supplies Failed - 05/29/2023  9:21 AM      Failed - Valid encounter within last 12 months    Recent Outpatient Visits   None     Future Appointments             In 6 days Aileen Alexanders, NP [place] [facility], [facility]

## 2023-06-01 ENCOUNTER — Ambulatory Visit: Admitting: Dietician

## 2023-06-04 ENCOUNTER — Ambulatory Visit: Payer: Self-pay | Admitting: Nurse Practitioner

## 2023-06-04 ENCOUNTER — Encounter: Payer: Self-pay | Admitting: Nurse Practitioner

## 2023-06-04 VITALS — BP 134/91 | HR 93 | Temp 97.8°F | Wt 241.0 lb

## 2023-06-04 DIAGNOSIS — Z0000 Encounter for general adult medical examination without abnormal findings: Secondary | ICD-10-CM

## 2023-06-04 DIAGNOSIS — I10 Essential (primary) hypertension: Secondary | ICD-10-CM

## 2023-06-04 DIAGNOSIS — Z9884 Bariatric surgery status: Secondary | ICD-10-CM

## 2023-06-04 DIAGNOSIS — E1169 Type 2 diabetes mellitus with other specified complication: Secondary | ICD-10-CM

## 2023-06-04 DIAGNOSIS — Z136 Encounter for screening for cardiovascular disorders: Secondary | ICD-10-CM

## 2023-06-04 MED ORDER — LISINOPRIL 20 MG PO TABS
20.0000 mg | ORAL_TABLET | Freq: Every day | ORAL | 1 refills | Status: DC
Start: 2023-06-04 — End: 2023-09-07

## 2023-06-04 NOTE — Assessment & Plan Note (Signed)
 Chronic. Last A1c was 8.4% in January.  Doing well with Mounjaro, Farxiga  and Glipizide .  If A1c remains elevated, will increase Mounjaro to 12.5mg  weekly.  Eye exam up to date.  Labs ordered today.  Follow up in 3 months.  Call sooner if concerns arise.

## 2023-06-04 NOTE — Assessment & Plan Note (Signed)
 Chronic.  Not well controlled.  Will increase dose of Lisinopril  to 20mg  daily.  Labs ordered today.  Return to clinic in 1 month for reevaluation.  Call sooner if concerns arise.

## 2023-06-04 NOTE — Assessment & Plan Note (Signed)
 Working on bariatric surgery.  Labs ordered for patient during visit.

## 2023-06-04 NOTE — Progress Notes (Signed)
 BP (!) 134/91   Pulse 93   Temp 97.8 F (36.6 C) (Oral)   Wt 241 lb (109.3 kg)   SpO2 95%   BMI 48.68 kg/m    Subjective:    Patient ID: Carmen [name], female    DOB: [birth year], 48 y.o.   MRN: [id number]  HPI: Carmen [name] is a 48 y.o. female presenting on 06/04/2023 for comprehensive medical examination. Current medical complaints include: working on Bariatric surgery   She currently lives with: Menopausal Symptoms: no  HYPERTENSION without Chronic Kidney Disease Hypertension status: controlled  Satisfied with current treatment? yes Duration of hypertension: years BP monitoring frequency:  not checking BP range:  BP medication side effects:  no Medication compliance: excellent compliance Previous BP meds:lisinopril  Aspirin: no Recurrent headaches: no Visual changes: no Palpitations: no Dyspnea: no Chest pain: no Lower extremity edema: no Dizzy/lightheaded: no  DIABETES Working on cutting back on her soda intake.  She is consuming about 2 sodas per day.  Hypoglycemic episodes:no Polydipsia/polyuria: no Visual disturbance: no Chest pain: no Paresthesias: no Glucose Monitoring: not checking  Accucheck frequency: daily  Fasting glucose: 109-232  Post prandial:  Evening:  Before meals: Taking Insulin?: no  Long acting insulin:  Short acting insulin: Blood Pressure Monitoring: not checking Retinal Examination: Not up to Date- needs an appt Foot Exam: Up to Date Diabetic Education: Not Completed Pneumovax: Up to Date Influenza:  up to date Aspirin: no  Patient is going through the process of bariatric surgery.  She is just really getting started on the process.    Depression Screen done today and results listed below:     11/25/2022    1:15 PM 08/25/2022   10:46 AM 05/21/2022   10:06 AM 02/19/2022    1:30 PM 11/06/2021    2:34 PM  Depression screen PHQ 2/9  Decreased Interest 0 0 0 1 2  Down, Depressed, Hopeless 0 0 0 1 2  PHQ - 2  Score 0 0 0 2 4  Altered sleeping 1 0 0 1 2  Tired, decreased energy 0 0 0 1 2  Change in appetite 0 1 2 1 2   Feeling bad or failure about yourself  0 0 0 0 1  Trouble concentrating 0 0 0 0 0  Moving slowly or fidgety/restless 0 0 0 0 0  Suicidal thoughts 0 0 0 0 0  PHQ-9 Score 1 1 2 5 11   Difficult doing work/chores Not difficult at all  Not difficult at all Not difficult at all Somewhat difficult    The patient does not have a history of falls. I did complete a risk assessment for falls. A plan of care for falls was documented.   Past Medical History:  Past Medical History:  Diagnosis Date   Anxiety    NO MEDS   Depression    NO MEDS   Diabetes mellitus without complication (HCC)    High cholesterol    Hypertension    Morbid obesity (HCC)     Surgical History:  Past Surgical History:  Procedure Laterality Date   CHOLECYSTECTOMY     COLONOSCOPY WITH PROPOFOL      COLONOSCOPY WITH PROPOFOL  N/A 11/17/2017   Procedure: COLONOSCOPY WITH PROPOFOL ;  Surgeon: Luke Salaam, MD;  Location: [facility] ENDOSCOPY;  Service: Gastroenterology;  Laterality: N/A;   COLONOSCOPY WITH PROPOFOL  N/A 11/27/2022   Procedure: COLONOSCOPY WITH PROPOFOL ;  Surgeon: Luke Salaam, MD;  Location: [facility] ENDOSCOPY;  Service: Gastroenterology;  Laterality: N/A;   ESOPHAGOGASTRODUODENOSCOPY (  EGD) WITH PROPOFOL  N/A 02/17/2017   Procedure: ESOPHAGOGASTRODUODENOSCOPY (EGD) WITH PROPOFOL ;  Surgeon: Luke Salaam, MD;  Location: [facility] ENDOSCOPY;  Service: Gastroenterology;  Laterality: N/A;   ROBOTIC ASSISTED LAPAROSCOPIC CHOLECYSTECTOMY N/A 04/23/2017   Procedure: ROBOTIC ASSISTED LAPAROSCOPIC CHOLECYSTECTOMY;  Surgeon: Alben Alma, MD;  Location: [facility] ORS;  Service: General;  Laterality: N/A;    Medications:  Current Outpatient Medications on File Prior to Visit  Medication Sig   atorvastatin  (LIPITOR) 20 MG tablet TAKE 1 TABLET BY MOUTH EVERY DAY IN THE EVENING   Blood Glucose Monitoring Suppl (FREESTYLE LITE)  w/Device KIT Used to check blood glucose up to 3 times daily   Blood Pressure Monitoring (OMRON 3 SERIES BP MONITOR) DEVI Use to monitor home blood pressure daily- please fill with brand covered by insurance   dapagliflozin  propanediol (FARXIGA ) 10 MG TABS tablet Take 1 tablet (10 mg total) by mouth daily before breakfast.   glipiZIDE  (GLUCOTROL ) 5 MG tablet Take 1 tablet (5 mg total) by mouth 2 (two) times daily before a meal.   glucose blood (FREESTYLE LITE) test strip Used to check blood glucose up to 3 times daily   Lancets (FREESTYLE) lancets Used to check blood glucose up to 3 times daily   prochlorperazine  (COMPAZINE ) 10 MG tablet Take 1 tablet (10 mg total) by mouth every 6 (six) hours as needed for nausea (headache).   tirzepatide (MOUNJARO) 10 MG/0.5ML Pen INJECT 10 MG UNDER THE SKIN ONE DAY A WEEK   No current facility-administered medications on file prior to visit.    Allergies:  No Known Allergies  Social History:  Social History   Socioeconomic History   Marital status: Single    Spouse name: Not on file   Number of children: Not on file   Years of education: Not on file   Highest education level: Associate degree: occupational, Scientist, product/process development, or vocational program  Occupational History   Not on file  Tobacco Use   Smoking status: Former    Current packs/day: 0.00    Average packs/day: 1.5 packs/day for 22.0 years (33.0 ttl pk-yrs)    Types: Cigarettes    Start date: 03/1998    Quit date: 03/2020    Years since quitting: 3.2   Smokeless tobacco: Never  Vaping Use   Vaping status: Never Used  Substance and Sexual Activity   Alcohol use: Not Currently    Alcohol/week: 5.0 standard drinks of alcohol    Types: 4 Shots of liquor, 1 Standard drinks or equivalent per week   Drug use: No   Sexual activity: Yes    Birth control/protection: None  Other Topics Concern   Not on file  Social History Narrative   Not on file   Social Drivers of Health   Financial  Resource Strain: Low Risk  (11/21/2022)   Overall Financial Resource Strain (CARDIA)    Difficulty of Paying Living Expenses: Not hard at all  Food Insecurity: No Food Insecurity (11/21/2022)   Hunger Vital Sign    Worried About Running Out of Food in the Last Year: Never true    Ran Out of Food in the Last Year: Never true  Transportation Needs: No Transportation Needs (11/21/2022)   PRAPARE - Administrator, Civil Service (Medical): No    Lack of Transportation (Non-Medical): No  Physical Activity: Unknown (11/21/2022)   Exercise Vital Sign    Days of Exercise per Week: Patient declined    Minutes of Exercise per Session: Not on file  Stress: No Stress Concern Present (11/21/2022)   [employer] of Occupational Health - Occupational Stress Questionnaire    Feeling of Stress : Only a little  Social Connections: Unknown (11/21/2022)   Social Connection and Isolation Panel [NHANES]    Frequency of Communication with Friends and Family: Once a week    Frequency of Social Gatherings with Friends and Family: Once a week    Attends Religious Services: Patient declined    Database administrator or Organizations: No    Attends Engineer, structural: Not on file    Marital Status: Never married  Intimate Partner Violence: Not on file   Social History   Tobacco Use  Smoking Status Former   Current packs/day: 0.00   Average packs/day: 1.5 packs/day for 22.0 years (33.0 ttl pk-yrs)   Types: Cigarettes   Start date: 03/1998   Quit date: 03/2020   Years since quitting: 3.2  Smokeless Tobacco Never   Social History   Substance and Sexual Activity  Alcohol Use Not Currently   Alcohol/week: 5.0 standard drinks of alcohol   Types: 4 Shots of liquor, 1 Standard drinks or equivalent per week    Family History:  Family History  Problem Relation Age of Onset   Diabetes Mother    Hyperlipidemia Mother    Hypertension Mother    Breast cancer Neg Hx     Past  medical history, surgical history, medications, allergies, family history and social history reviewed with patient today and changes made to appropriate areas of the chart.   Review of Systems  HENT:         Denies vision changes.  Eyes:  Negative for blurred vision and double vision.  Respiratory:  Negative for shortness of breath.   Cardiovascular:  Negative for chest pain, palpitations and leg swelling.  Neurological:  Negative for dizziness, tingling and headaches.  Endo/Heme/Allergies:  Negative for polydipsia.       Denies Polyuria   All other ROS negative except what is listed above and in the HPI.      Objective:    BP (!) 134/91   Pulse 93   Temp 97.8 F (36.6 C) (Oral)   Wt 241 lb (109.3 kg)   SpO2 95%   BMI 48.68 kg/m   Wt Readings from Last 3 Encounters:  06/04/23 241 lb (109.3 kg)  03/02/23 242 lb 6.4 oz (110 kg)  11/27/22 242 lb (109.8 kg)    Physical Exam Vitals and nursing note reviewed.  Constitutional:      General: She is awake. She is not in acute distress.    Appearance: Normal appearance. She is well-developed. She is obese. She is not ill-appearing.  HENT:     Head: Normocephalic and atraumatic.     Right Ear: Hearing, tympanic membrane, ear canal and external ear normal. No drainage.     Left Ear: Hearing, tympanic membrane, ear canal and external ear normal. No drainage.     Nose: Nose normal.     Right Sinus: No maxillary sinus tenderness or frontal sinus tenderness.     Left Sinus: No maxillary sinus tenderness or frontal sinus tenderness.     Mouth/Throat:     Mouth: Mucous membranes are moist.     Pharynx: Oropharynx is clear. Uvula midline. No pharyngeal swelling, oropharyngeal exudate or posterior oropharyngeal erythema.  Eyes:     General: Lids are normal.        Right eye: No discharge.  Left eye: No discharge.     Extraocular Movements: Extraocular movements intact.     Conjunctiva/sclera: Conjunctivae normal.     Pupils:  Pupils are equal, round, and reactive to light.     Visual Fields: Right eye visual fields normal and left eye visual fields normal.  Neck:     Thyroid: No thyromegaly.     Vascular: No carotid bruit.     Trachea: Trachea normal.  Cardiovascular:     Rate and Rhythm: Normal rate and regular rhythm.     Heart sounds: Normal heart sounds. No murmur heard.    No gallop.  Pulmonary:     Effort: Pulmonary effort is normal. No accessory muscle usage or respiratory distress.     Breath sounds: Normal breath sounds.  Chest:  Breasts:    Right: Normal.     Left: Normal.  Abdominal:     General: Bowel sounds are normal.     Palpations: Abdomen is soft. There is no hepatomegaly or splenomegaly.     Tenderness: There is no abdominal tenderness.  Musculoskeletal:        General: Normal range of motion.     Cervical back: Normal range of motion and neck supple.     Right lower leg: No edema.     Left lower leg: No edema.  Lymphadenopathy:     Head:     Right side of head: No submental, submandibular, tonsillar, preauricular or posterior auricular adenopathy.     Left side of head: No submental, submandibular, tonsillar, preauricular or posterior auricular adenopathy.     Cervical: No cervical adenopathy.     Upper Body:     Right upper body: No supraclavicular, axillary or pectoral adenopathy.     Left upper body: No supraclavicular, axillary or pectoral adenopathy.  Skin:    General: Skin is warm and dry.     Capillary Refill: Capillary refill takes less than 2 seconds.     Findings: No rash.  Neurological:     Mental Status: She is alert and oriented to person, place, and time.     Gait: Gait is intact.  Psychiatric:        Attention and Perception: Attention normal.        Mood and Affect: Mood normal.        Speech: Speech normal.        Behavior: Behavior normal. Behavior is cooperative.        Thought Content: Thought content normal.        Judgment: Judgment normal.      Results for orders placed or performed in visit on 03/12/23  HM DIABETES EYE EXAM   Collection Time: 02/16/21 12:00 AM  Result Value Ref Range   HM Diabetic Eye Exam No Retinopathy No Retinopathy      Assessment & Plan:   Problem List Items Addressed This Visit       Cardiovascular and Mediastinum   Essential hypertension--Lisinopril    Chronic.  Not well controlled.  Will increase dose of Lisinopril  to 20mg  daily.  Labs ordered today.  Return to clinic in 1 month for reevaluation.  Call sooner if concerns arise.       Relevant Medications   lisinopril  (ZESTRIL ) 20 MG tablet     Endocrine   Diabetes mellitus (HCC) - Primary   Chronic. Last A1c was 8.4% in January.  Doing well with Mounjaro, Farxiga  and Glipizide .  If A1c remains elevated, will increase Mounjaro to 12.5mg  weekly.  Eye  exam up to date.  Labs ordered today.  Follow up in 3 months.  Call sooner if concerns arise.       Relevant Medications   lisinopril  (ZESTRIL ) 20 MG tablet   Other Relevant Orders   HgB A1c     Other   Morbid obesity (HCC) 260 lb   Working on bariatric surgery.  Labs ordered for patient during visit.       Other Visit Diagnoses       Annual physical exam       Relevant Orders   CBC with Differential/Platelet   Comprehensive metabolic panel with GFR   Lipid panel   TSH     Screening for ischemic heart disease         Bariatric surgery status       Relevant Orders   CBC with Differential/Platelet   Comprehensive metabolic panel with GFR   Lipid panel   TSH   HgB A1c   T4, free   T4   Iron Binding Cap (TIBC)([facility])   H. pylori antigen, stool   B12   Folate   INR/PT   Vitamin D (25 hydroxy)        Follow up plan: Return in about 3 months (around 09/03/2023) for HTN, HLD, DM2 FU.   LABORATORY TESTING:  - Pap smear: up to date  IMMUNIZATIONS:   - Tdap: Tetanus vaccination status reviewed: last tetanus booster within 10 years. - Influenza: Postponed to  flu season - Pneumovax: Up to date - Prevnar: Not applicable - COVID: Not applicable - HPV: Not applicable - Shingrix vaccine: Not applicable  SCREENING: -Mammogram: Ordered today  - Colonoscopy: Up to date  - Bone Density: Not applicable  -Hearing Test: Not applicable  -Spirometry: Not applicable   PATIENT COUNSELING:   Advised to take 1 mg of folate supplement per day if capable of pregnancy.   Sexuality: Discussed sexually transmitted diseases, partner selection, use of condoms, avoidance of unintended pregnancy  and contraceptive alternatives.   Advised to avoid cigarette smoking.  I discussed with the patient that most people either abstain from alcohol or drink within safe limits (<=14/week and <=4 drinks/occasion for males, <=7/weeks and <= 3 drinks/occasion for females) and that the risk for alcohol disorders and other health effects rises proportionally with the number of drinks per week and how often a drinker exceeds daily limits.  Discussed cessation/primary prevention of drug use and availability of treatment for abuse.   Diet: Encouraged to adjust caloric intake to maintain  or achieve ideal body weight, to reduce intake of dietary saturated fat and total fat, to limit sodium intake by avoiding high sodium foods and not adding table salt, and to maintain adequate dietary potassium and calcium  preferably from fresh fruits, vegetables, and low-fat dairy products.    stressed the importance of regular exercise  Injury prevention: Discussed safety belts, safety helmets, smoke detector, smoking near bedding or upholstery.   Dental health: Discussed importance of regular tooth brushing, flossing, and dental visits.    NEXT PREVENTATIVE PHYSICAL DUE IN 1 YEAR. Return in about 3 months (around 09/03/2023) for HTN, HLD, DM2 FU.

## 2023-06-05 ENCOUNTER — Encounter: Payer: Self-pay | Admitting: Nurse Practitioner

## 2023-06-05 LAB — COMPREHENSIVE METABOLIC PANEL WITH GFR
ALT: 37 IU/L — ABNORMAL HIGH (ref 0–32)
AST: 20 IU/L (ref 0–40)
Albumin: 4.4 g/dL (ref 3.9–4.9)
Alkaline Phosphatase: 147 IU/L — ABNORMAL HIGH (ref 44–121)
BUN/Creatinine Ratio: 13 (ref 9–23)
BUN: 12 mg/dL (ref 6–24)
Bilirubin Total: 0.4 mg/dL (ref 0.0–1.2)
CO2: 21 mmol/L (ref 20–29)
Calcium: 9.4 mg/dL (ref 8.7–10.2)
Chloride: 102 mmol/L (ref 96–106)
Creatinine, Ser: 0.95 mg/dL (ref 0.57–1.00)
Globulin, Total: 2.7 g/dL (ref 1.5–4.5)
Glucose: 163 mg/dL — ABNORMAL HIGH (ref 70–99)
Potassium: 4.1 mmol/L (ref 3.5–5.2)
Sodium: 138 mmol/L (ref 134–144)
Total Protein: 7.1 g/dL (ref 6.0–8.5)
eGFR: 74 mL/min/{1.73_m2} (ref >59)

## 2023-06-05 LAB — CBC WITH DIFFERENTIAL/PLATELET
Basophils Absolute: 0.1 10*3/uL (ref 0.0–0.2)
Basos: 1 %
EOS (ABSOLUTE): 0.1 10*3/uL (ref 0.0–0.4)
Eos: 1 %
Hematocrit: 41 % (ref 34.0–46.6)
Hemoglobin: 13.7 g/dL (ref 11.1–15.9)
Immature Grans (Abs): 0 10*3/uL (ref 0.0–0.1)
Immature Granulocytes: 1 %
Lymphocytes Absolute: 1.8 10*3/uL (ref 0.7–3.1)
Lymphs: 27 %
MCH: 30.5 pg (ref 26.6–33.0)
MCHC: 33.4 g/dL (ref 31.5–35.7)
MCV: 91 fL (ref 79–97)
Monocytes Absolute: 0.5 10*3/uL (ref 0.1–0.9)
Monocytes: 7 %
Neutrophils Absolute: 4.1 10*3/uL (ref 1.4–7.0)
Neutrophils: 63 %
Platelets: 303 10*3/uL (ref 150–450)
RBC: 4.49 x10E6/uL (ref 3.77–5.28)
RDW: 11.2 % — ABNORMAL LOW (ref 11.7–15.4)
WBC: 6.5 10*3/uL (ref 3.4–10.8)

## 2023-06-05 LAB — T4: T4, Total: 8.6 ug/dL (ref 4.5–12.0)

## 2023-06-05 LAB — IRON AND TIBC
Iron Saturation: 41 % (ref 15–55)
Iron: 99 ug/dL (ref 27–159)
Total Iron Binding Capacity: 240 ug/dL — ABNORMAL LOW (ref 250–450)
UIBC: 141 ug/dL (ref 131–425)

## 2023-06-05 LAB — FOLATE: Folate: 7 ng/mL (ref >3.0)

## 2023-06-05 LAB — LIPID PANEL
Chol/HDL Ratio: 2 ratio (ref 0.0–4.4)
Cholesterol, Total: 156 mg/dL (ref 100–199)
HDL: 78 mg/dL (ref >39)
LDL Chol Calc (NIH): 64 mg/dL (ref 0–99)
Triglycerides: 75 mg/dL (ref 0–149)
VLDL Cholesterol Cal: 14 mg/dL (ref 5–40)

## 2023-06-05 LAB — HEMOGLOBIN A1C
Est. average glucose Bld gHb Est-mCnc: 186 mg/dL
Hgb A1c MFr Bld: 8.1 % — ABNORMAL HIGH (ref 4.8–5.6)

## 2023-06-05 LAB — TSH: TSH: 0.969 u[IU]/mL (ref 0.450–4.500)

## 2023-06-05 LAB — VITAMIN B12: Vitamin B-12: 591 pg/mL (ref 232–1245)

## 2023-06-05 LAB — PROTIME-INR
INR: 1 (ref 0.9–1.2)
Prothrombin Time: 10.9 s (ref 9.1–12.0)

## 2023-06-05 LAB — VITAMIN D 25 HYDROXY (VIT D DEFICIENCY, FRACTURES): Vit D, 25-Hydroxy: 19.8 ng/mL — ABNORMAL LOW (ref 30.0–100.0)

## 2023-06-05 LAB — T4, FREE: Free T4: 1.26 ng/dL (ref 0.82–1.77)

## 2023-06-05 MED ORDER — TIRZEPATIDE 12.5 MG/0.5ML ~~LOC~~ SOAJ: 12.5000 mg | SUBCUTANEOUS | 1 refills | Status: DC

## 2023-06-05 NOTE — Addendum Note (Signed)
 Addended by: Aileen Alexanders on: 06/05/2023 09:48 AM   Modules accepted: Orders

## 2023-06-09 ENCOUNTER — Other Ambulatory Visit (HOSPITAL_COMMUNITY)

## 2023-06-09 ENCOUNTER — Encounter: Attending: Nurse Practitioner | Admitting: Dietician

## 2023-06-09 ENCOUNTER — Encounter: Payer: Self-pay | Admitting: Dietician

## 2023-06-09 VITALS — Ht 59.0 in | Wt 241.4 lb

## 2023-06-09 DIAGNOSIS — E1169 Type 2 diabetes mellitus with other specified complication: Secondary | ICD-10-CM | POA: Diagnosis present

## 2023-06-09 DIAGNOSIS — E669 Obesity, unspecified: Secondary | ICD-10-CM | POA: Diagnosis present

## 2023-06-09 NOTE — Progress Notes (Signed)
 Nutrition Assessment for Bariatric Surgery: Pre-Surgery Behavioral and Nutrition Intervention Program   Medical Nutrition Therapy  Appt Start Time: 1648    End Time: 1755  Patient was seen on 06/09/2023 for Pre-Operative Nutrition Assessment. Purpose of todays visit  enhance perioperative outcomes along with a healthy weight maintenance   Referral stated Supervised Weight Loss (SWL) visits needed: 12  Planned surgery: RYGB Pt expectation of surgery: to get rid of diabetes and high blood pressure  NUTRITION ASSESSMENT   Anthropometrics  Start weight at [facility]: 241.4 lbs (date: 06/09/2023)  Height: 59 in BMI: 48.76 kg/m2     Clinical   Pharmacotherapy: History of weight loss medication used: Mounjaro, Ozempic   Medical hx: T2DM, obesity, HTN, hypercholesterolemia Medications: Mounjaro, lisinopril , glipizide , dapagliflozin  propanediol, atorvastatin  Labs: A1c 8.1, Vit D 19.8, total iron binding capacity 240, glucose 163 Notable signs/symptoms: none noted Any previous deficiencies? 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  Pt states she has a thing for pop tarts, stating she didn't eat them at all 3-4 years ago. Pt states she is planning on the by-pass, but states it may change to the sleeve. Pt states she is still weight the options.  Current Physical Activity Recommendations state 150 minutes per week of moderate to vigorous movement including Cardio and 1-2 days of resistance activities as well as flexibility/balance activities:  Pts current physical activity: ADLs, with 0% recommendation reached   Sleep Hygiene: duration and quality: okay, some days may not be able to go  to sleep and sometimes can sleep all day. It is better than during the height of COVID, stating she suffered from insomnia really bad.  Current Patient Perceived Stress Level as stated by pt on a scale of 1-10:  4 (stress has calmed down some lately, since working from home)       Stress Management Techniques: therapy at [facility] taught her how to breath.  According to the Dietary Guidelines for Americans Recommendation: equivalent 1.5-2 cups fruits per day, equivalent 2-3 cups vegetables per day and at least half all grains whole  Fruit servings per day (on average): 0, meeting 0% recommendation  Non-starchy vegetable servings per day (on average): 1, meeting 33-50% recommendation  Whole Grains per day (on average): 0  Number of meals missed/skipped per week out of 21: 0  24-Hr Dietary Recall First Meal: [place] bacon and eggs, toast or oatmeal or grits or pancakes Snack:  Second Meal: fast food ([employer] chicken, beans and rice) Snack:  Third Meal: [place] Mike salad bowl ([place] with vegetables) or chicken, beans Snack: pop tart Beverages: regular sodas, sweet tea, water, juice  Alcoholic beverages per week: 0 (special occasions; social events)   Estimated Energy Needs Calories: 1500  NUTRITION DIAGNOSIS  Overweight/obesity (-3.3) related to past poor dietary habits and physical inactivity as evidenced by patient w/ planned RYGB surgery following dietary guidelines for continued weight loss.  NUTRITION INTERVENTION  Nutrition counseling (C-1) and education (E-2) to facilitate bariatric surgery goals.  Educated pt on micronutrient deficiencies post-surgery and behavioral/dietary strategies to start in order to mitigate that risk   Behavioral and Dietary Interventions Pre-Op Goals Reviewed with the Patient Nutrition: Healthy Eating Behaviors Switch to non-caloric, non-carbonated and non-caffeinated beverages  such as  water, unsweetened tea, Crystal Light and zero calorie  beverages (aim for 64 oz. per day) Cut out grazing between meals or at night  Find a protein shake you like Eat every 3-5 hours        Eliminate distractions while eating (TV, computer, reading, driving, texting) Take 40-98 minutes to eat a meal  Decrease high sugar foods/decrease high fat/fried foods Eliminate alcoholic beverages Increase protein intake (eggs, fish, chicken, yogurt) before surgery Eat non starchy vegetables 2 times a day 7 days a week Eat complex carbohydrates such as whole grains and fruits   Behavioral Modification: Physical Activity Increase my usual daily activity (use stairs, park farther, etc.) Engage in _______________________  activity  _______ minutes ______ times per week  Other:    _________________________________________________________________     Problem Solving I will think about my usual eating patterns and how to tweak them How can my friends and family support me Barriers to starting my changes Learn and understand appetite verses hunger   Healthy Coping Allow for ___________ activities per week to help me manage stress Reframe negative thoughts I will keep a picture of someone or something that is my inspiration & look at it daily   Monitoring  Weigh myself once a week  Measure my progress by monitoring how my clothes fit Keep a food record of what I eat and drink for the next ________ (time period) Take pictures of what I eat and drink for the next ________ (time period) Use an app to count steps/day for the next_______ (time period) Measure my progress such as increased energy and more restful sleep Monitor your acid reflux and bowel habits, are they getting better?   *Goals that are bolded indicate the pt would like to start working towards these  Handouts Provided Include  Bariatric Surgery handouts (Nutrition Visits, Pre Surgery Behavioral Change Goals, Protein Shakes Brands to Choose From, Vitamins & Mineral  Supplementation)  Learning Style & Readiness for Change Teaching method utilized: Visual, Auditory, and hands on  Demonstrated degree of understanding via: Teach Back  Readiness Level: preparation Barriers to learning/adherence to lifestyle change: affinity for soda   RD's Notes for Next Visit Patient progress toward chosen goals    MONITORING & EVALUATION Dietary intake, weekly physical activity, body weight, and preoperative behavioral change goals   Next Steps  Patient is to follow up at [facility] in two weeks for first SWL visit.

## 2023-06-10 ENCOUNTER — Ambulatory Visit (HOSPITAL_COMMUNITY): Payer: Self-pay | Admitting: Licensed Clinical Social Worker

## 2023-06-10 DIAGNOSIS — F4323 Adjustment disorder with mixed anxiety and depressed mood: Secondary | ICD-10-CM
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
## 2023-06-10 NOTE — Progress Notes (Signed)
 1

## 2023-06-13 LAB — H. PYLORI ANTIGEN, STOOL: H pylori Ag, Stl: NEGATIVE

## 2023-06-24 ENCOUNTER — Institutional Professional Consult (permissible substitution): Admitting: Neurology

## 2023-06-26 ENCOUNTER — Ambulatory Visit (HOSPITAL_COMMUNITY)
Admission: RE | Admit: 2023-06-26 | Discharge: 2023-06-26 | Disposition: A | Discharge status: Home / Self Care | Source: Ambulatory Visit | Attending: General Surgery | Admitting: General Surgery

## 2023-06-26 ENCOUNTER — Other Ambulatory Visit (HOSPITAL_COMMUNITY): Payer: Self-pay | Admitting: General Surgery

## 2023-06-26 ENCOUNTER — Encounter (HOSPITAL_COMMUNITY)
Admission: RE | Admit: 2023-06-26 | Discharge: 2023-06-26 | Disposition: A | Discharge status: Home / Self Care | Source: Ambulatory Visit | Attending: General Surgery | Admitting: General Surgery

## 2023-06-26 ENCOUNTER — Other Ambulatory Visit: Payer: Self-pay

## 2023-06-26 DIAGNOSIS — E1165 Type 2 diabetes mellitus with hyperglycemia: Secondary | ICD-10-CM | POA: Insufficient documentation

## 2023-06-26 DIAGNOSIS — Z01818 Encounter for other preprocedural examination: Secondary | ICD-10-CM | POA: Diagnosis present

## 2023-06-26 DIAGNOSIS — Z794 Long term (current) use of insulin: Secondary | ICD-10-CM

## 2023-06-29 ENCOUNTER — Encounter: Attending: General Surgery | Admitting: Dietician

## 2023-06-29 ENCOUNTER — Encounter: Payer: Self-pay | Admitting: Dietician

## 2023-06-29 VITALS — Ht 59.0 in | Wt 242.1 lb

## 2023-06-29 DIAGNOSIS — E669 Obesity, unspecified: Secondary | ICD-10-CM | POA: Diagnosis present

## 2023-06-29 NOTE — Progress Notes (Signed)
 Supervised Weight Loss Visit Bariatric Nutrition Education Appt Start Time: 1114    End Time: 1130  Planned surgery: RYGB Pt expectation of surgery: to get rid of diabetes and high blood pressure  Referral stated Supervised Weight Loss (SWL) visits needed: 12  1 out of 12 SWL Appointments   NUTRITION ASSESSMENT   Anthropometrics  Start weight at [facility]: 241.4 lbs (date: 06/09/2023)  Height: 59 in Weight today: 242.1 lb BMI: 48.76 kg/m2     Clinical   Pharmacotherapy: History of weight loss medication used: Mounjaro, Ozempic   Medical hx: T2DM, obesity, HTN, hypercholesterolemia Medications: Mounjaro, lisinopril , glipizide , dapagliflozin  propanediol, atorvastatin  Labs: A1c 8.1, Vit D 19.8, total iron binding capacity 240, glucose 163 Notable signs/symptoms: none noted Any previous deficiencies? No  Lifestyle & Dietary Hx  Pt states she is drinking more water than normal, stating she is trying to get rid of the carbonation and sodas. Pt states she is going to try some water with flavorings, like crystal light. Pt states she is going to overcome sodas, stating if she can stop smoking she can stop drinking sodas. Pt states she needs to increase her physical activity, stating that is what she wants her next goals to be. Pt states she has some resistance bands she can use too.  Estimated daily fluid intake: 24 oz (just plain water)... also sodas Supplements: Vit D Current average weekly physical activity: stood and walked at a festival from 4-8 pm one day.  24-Hr Dietary Recall First Meal: [place] bacon and eggs, toast or oatmeal or grits or pancakes Snack:  Second Meal: fast food (Bojagles chicken, beans and rice) Snack:  Third Meal: [place] Mike salad bowl ([place] with vegetables) or chicken, beans Snack: pop tart Beverages: regular sodas, sweet tea, water, juice  Alcoholic beverages per week: 0 (special occasions; social events)   Estimated Energy Needs Calories:  1500  NUTRITION DIAGNOSIS  Overweight/obesity ([place]-3.3) related to past poor dietary habits and physical inactivity as evidenced by patient w/ planned RYGB surgery following dietary guidelines for continued weight loss.  NUTRITION INTERVENTION  Nutrition counseling (C-1) and education (E-2) to facilitate bariatric surgery goals.  Physical activity offers a wide range of health benefits: Mental and Cognitive Benefits Reduces symptoms of depression and anxiety Improves mood and emotional well-being Enhances brain function and cognitive performance Reduces risk of dementia and cognitive decline with age Cardiovascular and Metabolic Health Lowers blood pressure and improves cholesterol levels Reduces risk of heart disease and stroke Improves circulation and heart function Helps regulate blood sugar and lowers risk of type 2 diabetes Musculoskeletal Health Strengthens bones and muscles Improves balance and coordination, reducing fall risk in older adults Supports joint health and reduces symptoms of arthritis Weight and Body Composition Helps maintain a healthy weight Reduces body fat and builds lean muscle Boosts metabolism and energy expenditure Sleep and Recovery Improves sleep quality and duration Reduces insomnia and sleep disturbances Immune and Longevity Benefits Boosts immune system function Lowers risk of certain cancers (e.g., breast, colon) Increases life expectancy and reduces all-cause mortality  Pre-Op Goals Reviewed with the Patient  Pre-Op Goals Progress & New Goals Continue: switch to non-caloric, non-carbonated and non-caffeinated beverages such as  water, unsweetened tea, Crystal Light and zero calorie beverages (aim for 64 oz. per day) New: increase physical activity; 2-3 days per week, increase time spent walking, aiming for 20-30 minutes  Handouts Provided Include  Health Benefits of Physical Activity  Learning Style & Readiness for Change Teaching method  utilized: Visual &  Auditory  Demonstrated degree of understanding via: Teach Back  Readiness Level: preparation Barriers to learning/adherence to lifestyle change: affinity for soda  RD's Notes for next Visit  Pt progress toward chosen goals  MONITORING & EVALUATION Dietary intake, weekly physical activity, body weight, and pre-op goals in 1 month.   Next Steps  Patient is to return to [facility] in 2 weeks for next SWL visit.

## 2023-06-30 ENCOUNTER — Ambulatory Visit (INDEPENDENT_AMBULATORY_CARE_PROVIDER_SITE_OTHER): Admitting: Neurology

## 2023-06-30 ENCOUNTER — Encounter: Payer: Self-pay | Admitting: Neurology

## 2023-06-30 VITALS — BP 112/90 | HR 80 | Ht 59.0 in | Wt 244.0 lb

## 2023-06-30 DIAGNOSIS — G4733 Obstructive sleep apnea (adult) (pediatric): Secondary | ICD-10-CM

## 2023-06-30 DIAGNOSIS — G4734 Idiopathic sleep related nonobstructive alveolar hypoventilation: Secondary | ICD-10-CM

## 2023-06-30 DIAGNOSIS — R0683 Snoring: Secondary | ICD-10-CM | POA: Diagnosis not present

## 2023-06-30 DIAGNOSIS — Z6841 Body Mass Index (BMI) 40.0 and over, adult: Secondary | ICD-10-CM

## 2023-06-30 DIAGNOSIS — R351 Nocturia: Secondary | ICD-10-CM

## 2023-06-30 NOTE — Patient Instructions (Signed)
 Sleep Apnea Test: What to Expect  Sleep apnea is a condition that affects your breathing while you're sleeping. You may have shallow breathing or stop breathing for short periods of time. Sleep apnea screening is a test to check if you're at risk for sleep apnea. The test includes questions. It will only takes a few minutes. Your health care provider may ask you to have this test before a surgery or as part of a physical exam. What are the symptoms of sleep apnea? Snoring. Waking up often at night. Daytime sleepiness. Pauses in breathing. Choking or gasping during sleep. Being annoyed easily. Forgetfulness. Trouble thinking clearly. Depression. Personality changes. Headaches in the morning. Most people with sleep apnea do not know that they have it. What are the advantages of sleep apnea screening? Getting screened for sleep apnea can help: Keep you safer. Your providers need to know whether or not you have sleep apnea, especially if you're having surgery or have other long-term, or chronic, health conditions. Improve your health and help you get a better night's rest. Restful sleep can help you: Have more energy. Lose weight. Improve high blood pressure. Improve diabetes management. Prevent stroke. Prevent car accidents. What happens before the screening? You may talk with your provider about the screening and what other tests may be recommended based on the screening. What happens during the screening? Screening usually includes being asked a list of questions about your sleep quality. Some questions you may be asked include: Do you snore? Is your sleep restless? Do you have daytime sleepiness? Has a partner or spouse told you that you stop breathing, choke, or gasp during sleep? Have you had trouble concentrating or memory loss? What is your age? What is your neck circumference? To measure your neck, keep your back straight and gently wrap the tape measure around your neck.  Put the tape measure at the middle of your neck, between your chin and collarbone. What is your sex assigned at birth? Do you have high blood pressure or are you being treated for high blood pressure? If your screening test is positive, you're at risk for the condition. More tests may be needed to confirm a diagnosis of sleep apnea. What can I expect after the screening? Your provider will go over the results of the screening with you and make recommendations based on the results of the test. Where to find more information You can find screening tools online or at your health care clinic. To learn more, go to these websites: Centers for Disease Control and Prevention: DiningCalendar.de. Then: Click Health Topics A-Z. Type "sleep apnea" in the search box. National Heart, Lung, and Blood Institute: BuffaloDryCleaner.gl Contact a health care provider if: You think that you may have sleep apnea. This information is not intended to replace advice given to you by your health care provider. Make sure you discuss any questions you have with your health care provider. Document Revised: 07/05/2022 Document Reviewed: 07/05/2022 Elsevier Patient Education  2024 [employer].

## 2023-06-30 NOTE — Progress Notes (Signed)
 SLEEP MEDICINE CLINIC    Provider:  Neomia Banner, MD  Primary Care Physician:  Aileen Alexanders, NP [address] [place] [address]     Referring Provider: Derral Flick, Md [address] [place],  [place] [address]          Chief Complaint according to patient   Patient presents with:     New Patient (Initial Visit)           HISTORY OF PRESENT ILLNESS:  Carmen [name] is a 48 y.o. female patient who is seen upon referral  from dr Dorrie Gaudier , Teacher, English as a foreign language, on 06/30/2023.  Chief concern according to patient :  In preparation for bariatric surgery , gastric  bypass surgery. Planned for October 2025. She is still in the counseling and dietary classes. She needs a sleep study now.     I have the pleasure of seeing Carmen [name] on 06/30/23 a right-handed female with a possible sleep disorder.      Sleep relevant medical history: obesity - struggled with weight all her life, but exacerbated by steroid treatments age 78 after experiencing  vision loss. The patient reports she had severe sinus  disease at the time (?). She had preceding severe headaches and was dx with high fluid pressure ( pseudotumor? ) . Insomnia after Covid-  pandemic, anxiety exacerbated insomnia.   Nocturia/ 3 times, related to blood glucose levels, DM2.  No Tonsillectomy, milder sinus problems now,  but some allergic headaches , no TBI history.  wisdom teeth removal at age 13. No braces./Overbite .    Family medical /sleep history: Mother had a recent sleep study, mild OSA-   Social history:  Patient is working as a Geneticist, molecular at [employer] ,  and lives in a household with her mother, one dog.  No children.  The patient currently works  in daytime. Was a shift worker until 2017 , some years ago. 12 hours shifts.  Tobacco use: quit over the last 2 years-  none .  ETOH use ' None ,  Caffeine intake in form of Coffee( /) Soda( 2-3 drinks a day)  Tea ( 1 glass in AM )  no  energy drinks Exercise in form of lifting weights, walking .   Hobbies : pain by numbers.       Sleep habits are as follows: The patient's dinner time is between 6-8 PM. The patient goes to bed at 10 PM and continues to sleep for 5-6 hours, wakes for several  bathroom breaks, bedroom is cool, quiet and dark.  The preferred sleep position is laterally  , with the support of 5-6  pillows.  Dreams are reportedly frequent.   The patient wakes up with an alarm. Set for 6, but 7  AM is the usual rise time. She reports not feeling refreshed or restored in AM, with  rare symptoms of a dry mouth, no morning headaches, and residual fatigue.  Naps are taken infrequently, they don't feel good.     Review of Systems: Out of a complete 14 system review, the patient complains of only the following symptoms, and all other reviewed systems are negative.:  Fatigue, sleepiness , loud snoring,  apnea witnessed by mother.   fragmented sleep, Insomnia after bathroom breaks.    How likely are you to doze in the following situations: 0 = not likely, 1 = slight chance, 2 = moderate chance, 3 = high chance  Sitting and Reading? Watching Television? Sitting inactive in a public place (theater or meeting)? As a passenger in a car for an hour without a break? Lying down in the afternoon when circumstances permit? Sitting and talking to someone? Sitting quietly after lunch without alcohol? In a car, while stopped for a few minutes in traffic?   Total = 1/ 24 points   FSS endorsed at 20/ 63 points.   Social History   Socioeconomic History   Marital status: Single    Spouse name: Not on file   Number of children: Not on file   Years of education: Not on file   Highest education level: Associate degree: occupational, Scientist, product/process development, or vocational program  Occupational History   Not on file  Tobacco Use   Smoking status: Former    Current packs/day: 0.00    Average packs/day: 1.5  packs/day for 22.0 years (33.0 ttl pk-yrs)    Types: Cigarettes    Start date: 03/1998    Quit date: 03/2020    Years since quitting: 3.2   Smokeless tobacco: Never  Vaping Use   Vaping status: Never Used  Substance and Sexual Activity   Alcohol use: Not Currently    Alcohol/week: 5.0 standard drinks of alcohol    Types: 4 Shots of liquor, 1 Standard drinks or equivalent per week   Drug use: No   Sexual activity: Yes    Birth control/protection: None  Other Topics Concern   Not on file  Social History Narrative   Not on file   Social Drivers of Health   Financial Resource Strain: Low Risk  (11/21/2022)   Overall Financial Resource Strain (CARDIA)    Difficulty of Paying Living Expenses: Not hard at all  Food Insecurity: No Food Insecurity (11/21/2022)   Hunger Vital Sign    Worried About Running Out of Food in the Last Year: Never true    Ran Out of Food in the Last Year: Never true  Transportation Needs: No Transportation Needs (11/21/2022)   PRAPARE - Administrator, Civil Service (Medical): No    Lack of Transportation (Non-Medical): No  Physical Activity: Unknown (11/21/2022)   Exercise Vital Sign    Days of Exercise per Week: Patient declined    Minutes of Exercise per Session: Not on file  Stress: No Stress Concern Present (11/21/2022)   [employer] of Occupational Health - Occupational Stress Questionnaire    Feeling of Stress : Only a little  Social Connections: Unknown (11/21/2022)   Social Connection and Isolation Panel [NHANES]    Frequency of Communication with Friends and Family: Once a week    Frequency of Social Gatherings with Friends and Family: Once a week    Attends Religious Services: Patient declined    Database administrator or Organizations: No    Attends Engineer, structural: Not on file    Marital Status: Never married    Family History  Problem Relation Age of Onset   Diabetes Mother    Hyperlipidemia Mother     Hypertension Mother    Breast cancer Neg Hx     Past Medical History:  Diagnosis Date   Anxiety    NO MEDS   Depression    NO MEDS   Diabetes mellitus without complication (HCC)    High cholesterol    Hypertension    Morbid obesity (HCC)     Past Surgical History:  Procedure Laterality Date   CHOLECYSTECTOMY     COLONOSCOPY  WITH PROPOFOL      COLONOSCOPY WITH PROPOFOL  N/A 11/17/2017   Procedure: COLONOSCOPY WITH PROPOFOL ;  Surgeon: Luke Salaam, MD;  [facility] END[facility]COPY;  Service: Gastroenterology;  Laterality: N/A;   COLONOSCOPY WITH PROPOFOL  N/A 11/27/2022   Procedure: COLONOSCOPY WITH PROPOFOL ;  Surgeon: Luke Salaam, MD;  Location: [facility] ENDOSCOPY;  Service: Gastroenterology;  Laterality: N/A;   ESOPHAGOGASTRODUODENOSCOPY (EGD) WITH PROPOFOL  N/A 02/17/2017   Procedure: ESOPHAGOGASTRODUODENOSCOPY (EGD) WITH PROPOFOL ;  Surgeon: Luke Salaam, MD;  Location: [facility] ENDOSCOPY;  Service: Gastroenterology;  Laterality: N/A;   ROBOTIC ASSISTED LAPAROSCOPIC CHOLECYSTECTOMY N/A 04/23/2017   Procedure: ROBOTIC ASSISTED LAPAROSCOPIC CHOLECYSTECTOMY;  Surgeon: Alben Alma, MD;  Location: [facility] ORS;  Service: General;  Laterality: N/A;     Current Outpatient Medications on File Prior to Visit  Medication Sig Dispense Refill   atorvastatin  (LIPITOR) 20 MG tablet TAKE 1 TABLET BY MOUTH EVERY DAY IN THE EVENING 90 tablet 1   Blood Glucose Monitoring Suppl (FREESTYLE LITE) w/Device KIT Used to check blood glucose up to 3 times daily 1 kit 0   Blood Pressure Monitoring (OMRON 3 SERIES BP MONITOR) DEVI Use to monitor home blood pressure daily- please fill with brand covered by insurance 1 each 0   dapagliflozin  propanediol (FARXIGA ) 10 MG TABS tablet Take 1 tablet (10 mg total) by mouth daily before breakfast. 90 tablet 1   glipiZIDE  (GLUCOTROL ) 5 MG tablet Take 1 tablet (5 mg total) by mouth 2 (two) times daily before a meal. 180 tablet 1   glucose blood (FREESTYLE LITE) test strip Used to check  blood glucose up to 3 times daily 100 each 12   Lancets (FREESTYLE) lancets Used to check blood glucose up to 3 times daily 100 each 12   lisinopril  (ZESTRIL ) 20 MG tablet Take 1 tablet (20 mg total) by mouth daily. TAKE 1 TABLET BY MOUTH EVERY DAY IN THE EVENING 90 tablet 1   prochlorperazine  (COMPAZINE ) 10 MG tablet Take 1 tablet (10 mg total) by mouth every 6 (six) hours as needed for nausea (headache). 20 tablet 0   tirzepatide (MOUNJARO) 12.5 MG/0.5ML Pen Inject 12.5 mg into the skin once a week. 6 mL 1   No current facility-administered medications on file prior to visit.    No Known Allergies   DIAGNOSTIC DATA (LABS, IMAGING, TESTING) - I reviewed patient records, labs, notes, testing and imaging myself where available.  Lab Results  Component Value Date   WBC 6.5 06/04/2023   HGB 13.7 06/04/2023   HCT 41.0 06/04/2023   MCV 91 06/04/2023   PLT 303 06/04/2023      Component Value Date/Time   NA 138 06/04/2023 1036   K 4.1 06/04/2023 1036   CL 102 06/04/2023 1036   CO2 21 06/04/2023 1036   GLUCOSE 163 (H) 06/04/2023 1036   GLUCOSE 144 (H) 07/23/2022 2300   BUN 12 06/04/2023 1036   CREATININE 0.95 06/04/2023 1036   CALCIUM  9.4 06/04/2023 1036   PROT 7.1 06/04/2023 1036   ALBUMIN 4.4 06/04/2023 1036   AST 20 06/04/2023 1036   ALT 37 (H) 06/04/2023 1036   ALKPHOS 147 (H) 06/04/2023 1036   BILITOT 0.4 06/04/2023 1036   GFRNONAA >60 07/23/2022 2300   GFRAA >60 02/10/2017 2228   Lab Results  Component Value Date   CHOL 156 06/04/2023   HDL 78 06/04/2023   LDLCALC 64 06/04/2023   TRIG 75 06/04/2023   CHOLHDL 2.0 06/04/2023   Lab Results  Component Value Date   HGBA1C 8.1 (  H) 06/04/2023   Lab Results  Component Value Date   VITAMINB12 591 06/04/2023   Lab Results  Component Value Date   TSH 0.969 06/04/2023    PHYSICAL EXAM:  Today's Vitals   06/30/23 1454  BP: (!) 112/90  Pulse: 80  Weight: 244 lb (110.7 kg)  Height: 4\' 11"  (1.499 m)   Body mass  index is 49.28 kg/m.   Wt Readings from Last 3 Encounters:  06/30/23 244 lb (110.7 kg)  06/29/23 242 lb 1.6 oz (109.8 kg)  06/09/23 241 lb 6.4 oz (109.5 kg)     Ht Readings from Last 3 Encounters:  06/30/23 4\' 11"  (1.499 m)  06/29/23 4\' 11"  (1.499 m)  06/09/23 4\' 11"  (1.499 m)      General: The patient is awake, alert and appears not in acute distress. The patient is well groomed. Head: Normocephalic, atraumatic. Neck is supple.  Mallampati 2-3,  neck circumference:17 inches .  Nasal airflow not fully patent. Right side more obstructed   Overbite is seen.  Dental status: biological  Cardiovascular:  Regular rate and cardiac rhythm by pulse,  without distended neck veins. Respiratory: Lungs are clear to auscultation.  Skin:  Without evidence of ankle edema. Trunk: The patient's posture is relaxed    NEUROLOGIC EXAM: The patient is awake and alert, oriented to place and time.   Memory subjective described as intact.  Attention span & concentration ability appears normal.  Speech is fluent,  without dysarthria, dysphonia or aphasia.  Mood and affect are appropriate.   Cranial nerves: no loss of smell or taste reported  Pupils are equal and briskly reactive to light. Funduscopic exam deferred. .  Extraocular movements in vertical and horizontal planes were intact and without nystagmus. No Diplopia. Visual fields by finger perimetry are intact. Hearing was intact to soft voice and finger rubbing.    Facial sensation intact to fine touch.  Facial motor strength is symmetric and tongue and uvula move midline.  Neck ROM : rotation, tilt and flexion extension were normal for age and shoulder shrug was symmetrical.    Motor exam:  Symmetric bulk, tone and ROM.   Normal tone without cog- wheeling, symmetric grip strength .   Sensory:   normal.  Proprioception tested in the upper extremities was normal.   Coordination: without evidence of ataxia, dysmetria or tremor.   Gait and  station: Patient could rise unassisted from a seated position, walked without assistive device.  Stance is of wider base..  Toe and heel walk were deferred.  Deep tendon reflexes: in the  upper and lower extremities are symmetric and intact.     ASSESSMENT AND PLAN 48 y.o. year old female  here with:    1) seen in preparation for Bariatric surgery, evaluation for OSA by HST pre OP  2) BMI is 49, neck size 18" . With an obesity related history  of pseudotumor cerebri. At age 44.  Poorly controlled DM2  before mounjaro/ Mother has witnessed snoring and apnea.   Plan : patient is OK with HST that is first to return results.    I plan to follow up either personally or through our NP within 3-4 months.   I would like to thank Aileen Alexanders, NP and Kinsinger, Alphonso Aschoff, Md [phone] [place],  [place] [address] for allowing me to meet with and to take care of this pleasant patient.     After spending a total time of  30  minutes face to face and additional time for physical and neurologic examination, review of laboratory studies,  personal review of imaging studies, reports and results of other testing and review of referral information / records as far as provided in visit,   Electronically signed by: Neomia Banner, MD 06/30/2023 3:31 PM  [facility] and [facility] Sleep Board certified by The [employer] of Sleep Medicine and Diplomate of the [employer] of Sleep Medicine. Board certified In Neurology through the ABPN, Fellow of the [employer] of Neurology.

## 2023-07-08 ENCOUNTER — Other Ambulatory Visit: Payer: Self-pay

## 2023-07-08 NOTE — Progress Notes (Signed)
   07/08/2023  Patient ID: Carmen [name], female   DOB: [birth year], 48 y.o.   MRN: [id number]  Subjective/Objective Telephone visit to follow-up on management of HTN and DM   Diabetes Management -Current medications:  Mounjaro 10mg  weekly, Farxiga  10mg  daily, glipizide  5mg  BID before a meal -Using Freestyle Lite to monitor BG at least once daily and endorses FBG ranging 114-187 -Patient has Mounjaro 12.5mg  on hand, but is using 10mg  pens remaining (1-2) before starting increased dose -A1c in March 8.1%, down some from 8.4% in January   Hypertension -Current medications:  lisinopril  20mg  daily -Lisinopril  increased at last OV due to elevated BP reading -Patient has home Omron 3 BP monitor, but she has not got batteries for it yet -Last OV reading improved at 112/90   Assessment/Plan   Diabetes Management -Increase Mounjaro to 12.5mg  weekly after completing 10mg  pens, continue Farxiga  10mg  daily, continue glipizide  5mg  BID -Monitor and record FBG daily   Hypertension -Continue current regimen at this time -Monitor/record home BP at least 3x/wk   Follow-up:  6 weeks   Linn Rich, PharmD, DPLA

## 2023-07-14 ENCOUNTER — Encounter: Payer: Self-pay | Admitting: Skilled Nursing Facility1

## 2023-07-14 ENCOUNTER — Ambulatory Visit (INDEPENDENT_AMBULATORY_CARE_PROVIDER_SITE_OTHER): Admitting: Neurology

## 2023-07-14 ENCOUNTER — Encounter: Attending: General Surgery | Admitting: Skilled Nursing Facility1

## 2023-07-14 VITALS — Ht 59.0 in | Wt 239.0 lb

## 2023-07-14 DIAGNOSIS — G4733 Obstructive sleep apnea (adult) (pediatric): Secondary | ICD-10-CM | POA: Diagnosis not present

## 2023-07-14 DIAGNOSIS — E1169 Type 2 diabetes mellitus with other specified complication: Secondary | ICD-10-CM | POA: Insufficient documentation

## 2023-07-14 DIAGNOSIS — R351 Nocturia: Secondary | ICD-10-CM

## 2023-07-14 DIAGNOSIS — R0683 Snoring: Secondary | ICD-10-CM

## 2023-07-14 NOTE — Progress Notes (Unsigned)
 Supervised Weight Loss Visit Bariatric Nutrition Education Appt Start Time: 4:47   End Time: 5:15  Planned surgery: RYGB Pt expectation of surgery: to get rid of diabetes and high blood pressure  Referral stated Supervised Weight Loss (SWL) visits needed: 12  2 out of 12 SWL Appointments   NUTRITION ASSESSMENT   Anthropometrics  Start weight at [facility]: 241.4 lbs (date: 06/09/2023)  Height: 59 in Weight today: 239 pounds   Clinical   Pharmacotherapy: History of weight loss medication used: Mounjaro, Ozempic   Medical hx: T2DM, obesity, HTN, hypercholesterolemia Medications: Mounjaro, lisinopril , glipizide , dapagliflozin  propanediol, atorvastatin  Labs: A1c 8.1, Vit D 19.8, total iron binding capacity 240, glucose 163 Notable signs/symptoms: none noted Any previous deficiencies? No  Lifestyle & Dietary Hx  Pt states she has been working on reducing her sodas able to get it to about 1 per day.  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  Pt states she has yet to pull out the walking pad or walk on the track.   After some discussion she has come to some understanding of her barriers and plans to discuss them with her therapist.   Estimated daily fluid intake: 24 oz (just plain water)... also sodas Supplements: Vit D Current average weekly physical activity: stood and walked at a festival from 4-8 pm one day.  24-Hr Dietary Recall First Meal: [place] bacon and eggs, toast or oatmeal or grits or pancakes Snack:  Second Meal: fast food ([employer] chicken, beans and rice) Snack:  Third Meal: [place] Mike salad bowl ([place] with vegetables) or chicken, beans Snack: pop tart Beverages: regular sodas, sweet tea, water, juice  Alcoholic beverages per week: 0 (special occasions; social events)   Estimated Energy Needs Calories: 1500  NUTRITION DIAGNOSIS  Overweight/obesity (Taylor-3.3) related to past poor dietary habits and  physical inactivity as evidenced by patient w/ planned RYGB surgery following dietary guidelines for continued weight loss.  NUTRITION INTERVENTION  Nutrition counseling (C-1) and education (E-2) to facilitate bariatric surgery goals. continued  Physical activity offers a wide range of health benefits: Mental and Cognitive Benefits Reduces symptoms of depression and anxiety Improves mood and emotional well-being Enhances brain function and cognitive performance Reduces risk of dementia and cognitive decline with age Cardiovascular and Metabolic Health Lowers blood pressure and improves cholesterol levels Reduces risk of heart disease and stroke Improves circulation and heart function Helps regulate blood sugar and lowers risk of type 2 diabetes Musculoskeletal Health Strengthens bones and muscles Improves balance and coordination, reducing fall risk in older adults Supports joint health and reduces symptoms of arthritis Weight and Body Composition Helps maintain a healthy weight Reduces body fat and builds lean muscle Boosts metabolism and energy expenditure Sleep and Recovery Improves sleep quality and duration Reduces insomnia and sleep disturbances Immune and Longevity Benefits Boosts immune system function Lowers risk of certain cancers (e.g., breast, colon) Increases life expectancy and reduces all-cause mortality  Pre-Op Goals Reviewed with the Patient  Pre-Op Goals Progress & New Goals Continue: switch to non-caloric, non-carbonated and non-caffeinated beverages such as  water, unsweetened tea, Crystal Light and zero calorie beverages (aim for 64 oz. per day) continue increase physical activity; 2-3 days per week, increase time spent walking, aiming for 20-30 minutes NEW: work on Jumping over the rope breathing and tightening core; get that down pat before actually attempting jump rope NEW: I will drink More water NEW: I will work on Walking  Handouts Previously  Provided Include  Health Benefits of Physical Activity  Learning Style & Readiness for Change Teaching method utilized: Visual & Auditory  Demonstrated degree of understanding via: Teach Back  Readiness Level: contemplation  Barriers to learning/adherence to lifestyle change: affinity for soda  RD's Notes for next Visit  Pt progress toward chosen goals  MONITORING & EVALUATION Dietary intake, weekly physical activity, body weight, and pre-op goals Next Steps  Patient is to return to [facility] in 1-2 weeks for next SWL visit.

## 2023-07-15 NOTE — Progress Notes (Signed)
 [facility] [employer] 48 year old female 02-16-1975   HOME SLEEP TEST REPORT ( by Watch PAT)   STUDY DATE: 07-14-3023 DATA 6.5.2025     ORDERING CLINICIAN: Neomia Banner, MD  REFERRING CLINICIAN: Harman Lightning, MD    CLINICAL INFORMATION/HISTORY: Pre OP- this HST was to be marked urgent.  Fatigue, sleepiness , loud snoring,  apnea witnessed by her mother.   Fragmented sleep after bathroom breaks.  seen in preparation for Bariatric surgery, evaluation for OSA by HST pre OP   2) BMI is 49, neck size 18 . With an obesity related history  of pseudotumor cerebri. At age 89.  Poorly controlled DM2  before mounjaro / Mother has witnessed snoring and apnea Epworth sleepiness score: 1/ 24 points   FSS endorsed at 20/ 63 points.    BMI: 49.28 kg/m   Neck Circumference: 18   FINDINGS:   Sleep Summary:   Total Recording Time (hours, min):     7 hours 21 minutes   Total Sleep Time (hours, min):   6 hours 24 minutes              Percent REM (%):    28.1%                                    Respiratory Indices:   Calculated pAHI (per AASM guideline): 7.8/h                       REM pAHI:     10.1/h                                            NREM pAHI:     6.9/h                         Positional AHI:    231 minutes of supine sleep were associated with an AHI of 3.4 and 114 minutes of right lateral sleep were to migrate surprise associated with an AHI of 13.9/h actually this patient had much less apnea in supine sleep  Snoring: Mean volume of snoring is indicated at 41 dB and snoring was present for 95% of the total recorded sleep time.                                              Oxygen Saturation Statistics:   Oxygen Saturation (%) Mean: 96%              O2 Saturation Range (%):    Between a nadir at 91 with a maximum saturation of 100% with 0 minutes of total hypoxia time.                                   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  No central sleep apnea was indicated.  This is not a REM sleep dependent apnea and is not associated with hypoxia.  I think this patient will by weight loss also lose this mild degree of apnea.  I am happy to prescribe CPAP until her weight loss goals have been reached.     RECOMMENDATION: I suggest an auto titration CPAP device although this patient's apnea truly is very mild.  My main consideration is her history of pseudotumor cerebri, and sleep apnea as well as obesity are risk factors for this condition. I will order an autotitration CPAP device at manufacturer's settings, 5 through 20 cm water pressure, 2 cm EPR, heated humidification, interface of patient's choice.  I would certainly like to see the patient once she has lost about 20% of her total total body weight as she may then have reached a degree of apnea that is no longer needing intervention.   INTERPRETING PHYSICIAN:   Neomia Banner, MD  [facility] and [facility] Sleep Board certified by The [employer] of Sleep Medicine and Diplomate of the [employer] of Sleep Medicine. Board certified In Neurology through the ABPN, Fellow of the [employer] of Neurology.

## 2023-07-24 ENCOUNTER — Encounter: Payer: Self-pay | Admitting: Dietician

## 2023-07-24 ENCOUNTER — Encounter: Attending: Dietician | Admitting: Dietician

## 2023-07-24 DIAGNOSIS — E669 Obesity, unspecified: Secondary | ICD-10-CM | POA: Insufficient documentation

## 2023-07-24 DIAGNOSIS — E1169 Type 2 diabetes mellitus with other specified complication: Secondary | ICD-10-CM | POA: Insufficient documentation

## 2023-07-24 NOTE — Progress Notes (Signed)
 Supervised Weight Loss Visit Bariatric Nutrition Education Appt Start Time: 8295   End Time: 0916  Planned surgery: RYGB Pt expectation of surgery: to get rid of diabetes and high blood pressure  Referral stated Supervised Weight Loss (SWL) visits needed: 12  3 out of 12 SWL Appointments   I connected with Taneesha Chlandra Pavia on 07/24/23 at  9:00 AM EDT virtually and verified that I am speaking with the correct person using two identifiers.  NUTRITION ASSESSMENT   Anthropometrics  Start weight at [facility]: 241.4 lbs (date: 06/09/2023)  Height: 59 in Weight today: virtual visit BMI:    Clinical   Pharmacotherapy: History of weight loss medication used: Mounjaro , Ozempic   Medical hx: T2DM, obesity, HTN, hypercholesterolemia Medications: Mounjaro , lisinopril , glipizide , dapagliflozin  propanediol, atorvastatin  Labs: A1c 8.1, Vit D 19.8, total iron binding capacity 240, glucose 163 Notable signs/symptoms: none noted Any previous deficiencies? No  Lifestyle & Dietary Hx  Pt states she has reduced the sodas, stating she has been getting a headache. Pt states she has been trying to drink more water. Pt states she needs to get her walking pad out, stating she will focus more on walking and coordination with the jump rope.  After some discussion she has come to some understanding of her barriers and plans to discuss them with her therapist.   Estimated daily fluid intake: 48 oz (just plain water) Supplements: Vit D Current average weekly physical activity: pedal at desk, daily.  24-Hr Dietary Recall First Meal: [place] bacon and eggs, toast or oatmeal or grits or pancakes Snack:  Second Meal: fast food (Bojagles chicken, beans and rice) Snack:  Third Meal: [place] Mike salad bowl ([place] with vegetables) or chicken, beans Snack: pop tart Beverages: regular sodas, sweet tea, water, juice  Alcoholic beverages per week: 0 (special occasions; social events)   Estimated Energy  Needs Calories: 1500  NUTRITION DIAGNOSIS  Overweight/obesity ([place]-3.3) related to past poor dietary habits and physical inactivity as evidenced by patient w/ planned RYGB surgery following dietary guidelines for continued weight loss.  NUTRITION INTERVENTION  Nutrition counseling (C-1) and education (E-2) to facilitate bariatric surgery goals. continued  Tracking fluid intake before bariatric surgery is vital. Adequate hydration during this phase is crucial to support overall bodily functions, prevent dehydration, and help the body adapt to significant dietary changes. Dehydration can lead to fatigue, dizziness, headaches, and even more serious complications. Furthermore, practicing consistent fluid intake in small, frequent sips before surgery helps patients develop the necessary habits for post-operative life, as their new, smaller stomach will require careful and continuous hydration to avoid dehydration and ensure proper healing and nutrient absorption.  Pre-Op Goals Reviewed with the Patient  Pre-Op Goals Progress & New Goals Continue: switch to non-caloric, non-carbonated and non-caffeinated beverages such as  water, unsweetened tea, Crystal Light and zero calorie beverages (aim for 64 oz. per day) continue increase physical activity; 2-3 days per week, increase time spent walking, aiming for 20-30 minutes; work on walking, consider using walking pad Continue: work on Jumping over the rope breathing and tightening core; get that down pat before actually attempting jump rope Continue: I will drink more water New: track fluid intake  Handouts Previously Provided Include  Health Benefits of Physical Activity  Learning Style & Readiness for Change Teaching method utilized: Visual & Auditory  Demonstrated degree of understanding via: Teach Back  Readiness Level: contemplation  Barriers to learning/adherence to lifestyle change: affinity for soda  RD's Notes for next Visit  Pt progress  toward chosen goals  MONITORING & EVALUATION Dietary intake, weekly physical activity, body weight, and pre-op goals Next Steps  Patient is to return to [facility] in 2 weeks for next SWL visit.

## 2023-07-26 ENCOUNTER — Ambulatory Visit: Payer: Self-pay | Admitting: Neurology

## 2023-07-26 DIAGNOSIS — R351 Nocturia: Secondary | ICD-10-CM

## 2023-07-26 DIAGNOSIS — R0683 Snoring: Secondary | ICD-10-CM

## 2023-07-26 NOTE — Procedures (Signed)
 [facility] 48 year old female 07/21/1975   HOME SLEEP TEST REPORT ( by Watch PAT)   STUDY DATE: 07-14-3023 DATA 6.5.2025     ORDERING CLINICIAN: Neomia Banner, MD  REFERRING CLINICIAN: Harman Lightning, MD    CLINICAL INFORMATION/HISTORY: Pre OP- this HST was to be marked urgent.  Fatigue, sleepiness , loud snoring,  apnea witnessed by her mother.   Fragmented sleep after bathroom breaks.  seen in preparation for Bariatric surgery, evaluation for OSA by HST pre OP   2) BMI is 49, neck size 18 . With an obesity related history  of pseudotumor cerebri. At age 45.  Poorly controlled DM2  before mounjaro / Mother has witnessed snoring and apnea Epworth sleepiness score: 1/ 24 points   FSS endorsed at 20/ 63 points.    BMI: 49.28 kg/m   Neck Circumference: 18   FINDINGS:   Sleep Summary:   Total Recording Time (hours, min):     7 hours 21 minutes   Total Sleep Time (hours, min):   6 hours 24 minutes              Percent REM (%):    28.1%                                    Respiratory Indices:   Calculated pAHI (per AASM guideline): 7.8/h                       REM pAHI:     10.1/h                                            NREM pAHI:     6.9/h                         Positional AHI:    231 minutes of supine sleep were associated with an AHI of 3.4 and 114 minutes of right lateral sleep were to migrate surprise associated with an AHI of 13.9/h actually this patient had much less apnea in supine sleep  Snoring: Mean volume of snoring is indicated at 41 dB and snoring was present for 95% of the total recorded sleep time.                                              Oxygen Saturation Statistics:   Oxygen Saturation (%) Mean: 96%              O2 Saturation Range (%):    Between a nadir at 91 with a maximum saturation of 100% with 0 minutes of total hypoxia time.                                   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  No central sleep apnea was indicated.  This is not a REM sleep dependent apnea and is not associated with hypoxia.  I think this patient will by weight loss also lose this mild degree of apnea.  I am happy to prescribe CPAP until her weight loss goals have been reached.     RECOMMENDATION: I suggest an auto titration CPAP device although this patient's apnea truly is very mild.  My main consideration is her history of pseudotumor cerebri, and sleep apnea as well as obesity are risk factors for this condition. I will order an autotitration CPAP device at manufacturer's settings, 5 through 20 cm water pressure, 2 cm EPR, heated humidification, interface of patient's choice.  I would certainly like to see the patient once she has lost about 20% of her total total body weight as she may then have reached a degree of apnea that is no longer needing intervention.   INTERPRETING PHYSICIAN:   Neomia Banner, MD  [facility] and [facility] Sleep Board certified by The [employer] of Sleep Medicine and Diplomate of the [employer] of [employer]. Board certified In Neurology through the ABPN, Fellow of the [employer] of Neu[employer].

## 2023-07-28 NOTE — Telephone Encounter (Signed)
-----   Message from [place] Dohmeier sent at 07/26/2023  8:06 PM EDT ----- Surprisingly mild apnea, all obstructive, no hypoxia. Not REM dependent.  I ordered an auto CPAP for mild apnea and snoring treatment until the patient has lost 15- 20% of her BMI, or about 40 pounds -the goal being to check if CPAP remains an appropriate intervention.  ----- Message ----- From: Neomia Banner, MD Sent: 07/26/2023   8:02 PM EDT To: Neomia Banner, MD

## 2023-07-28 NOTE — Telephone Encounter (Signed)
 Called the patient and reviewed the sleep study in detail with her. Advised of the findings that were present. Pt verbalized understanding and had no further questions at this time. Pt will reach out if anything is needed and was appreciative for the call.

## 2023-08-07 ENCOUNTER — Encounter: Payer: Self-pay | Admitting: Dietician

## 2023-08-07 ENCOUNTER — Encounter: Attending: General Surgery | Admitting: Dietician

## 2023-08-07 VITALS — Ht 59.0 in | Wt 239.3 lb

## 2023-08-07 DIAGNOSIS — E669 Obesity, unspecified: Secondary | ICD-10-CM | POA: Diagnosis present

## 2023-08-07 DIAGNOSIS — E1169 Type 2 diabetes mellitus with other specified complication: Secondary | ICD-10-CM | POA: Diagnosis present

## 2023-08-07 NOTE — Progress Notes (Signed)
 Supervised Weight Loss Visit Bariatric Nutrition Education Appt Start Time: [phone]   End Time: 1009  Planned surgery: RYGB Pt expectation of surgery: to get rid of diabetes and high blood pressure  Referral stated Supervised Weight Loss (SWL) visits needed: 12  4 out of 12 SWL Appointments   NUTRITION ASSESSMENT   Anthropometrics  Start weight at [facility]: 241.4 lbs (date: 06/09/2023)  Height: 59 in Weight today: 239.3 lbs BMI: 48.33   Clinical   Pharmacotherapy: History of weight loss medication used: Mounjaro , Ozempic   Medical hx: T2DM, obesity, HTN, hypercholesterolemia Medications: Mounjaro , lisinopril , glipizide , dapagliflozin  propanediol, atorvastatin  Labs: A1c 8.1, Vit D 19.8, total iron binding capacity 240, glucose 163 Notable signs/symptoms: none noted Any previous deficiencies? No  Lifestyle & Dietary Hx  Pt states she is still trying to get past the sodas, stating she is trying to get 2 one liter bottles in. Pt states she is getting a sick feeling in her throat trying to quit sodas. Pt states she would rather have just plain water if she can't have soda. Pt states she is willing to increase physical activity to 3 days per week and increase to 20-30 minutes per day. Pt states she is working on increasing non-starchy vegetables and not frying meats. Pt states she used to not drink with her meals.  After some discussion she has come to some understanding of her barriers and plans to discuss them with her therapist.   Estimated daily fluid intake: 32-64 oz (just plain water) Supplements: Vit D Current average weekly physical activity: pedal at desk, daily; walking on walking pad 2 days per week, for 10 minutes.  24-Hr Dietary Recall First Meal: [place] bacon and eggs, toast or oatmeal or grits or pancakes Snack:  Second Meal: fast food (Bojagles chicken, beans and rice) Snack:  Third Meal: [place] Mike salad bowl ([place] with vegetables) or chicken, beans Snack: pop  tart Beverages: regular sodas (one a day right now), water, grape juice (one day)  Alcoholic beverages per week: 0 (special occasions; social events)   Estimated Energy Needs Calories: 1500  NUTRITION DIAGNOSIS  Overweight/obesity ([place]-3.3) related to past poor dietary habits and physical inactivity as evidenced by patient w/ planned RYGB surgery following dietary guidelines for continued weight loss.  NUTRITION INTERVENTION  Nutrition counseling (C-1) and education (E-2) to facilitate bariatric surgery goals. continued  Tracking fluid intake before bariatric surgery is vital. Adequate hydration during this phase is crucial to support overall bodily functions, prevent dehydration, and help the body adapt to significant dietary changes. Dehydration can lead to fatigue, dizziness, headaches, and even more serious complications. Furthermore, practicing consistent fluid intake in small, frequent sips before surgery helps patients develop the necessary habits for post-operative life, as their new, smaller stomach will require careful and continuous hydration to avoid dehydration and ensure proper healing and nutrient absorption.  Pre-Op Goals Reviewed with the Patient  Pre-Op Goals Progress & New Goals Continue: switch to non-caloric, non-carbonated and non-caffeinated beverages such as  water, unsweetened tea, Crystal Light and zero calorie beverages (aim for 64 oz. per day) Re-engage: increase physical activity; aim for 3 days per week walking on walking pad, aiming for 20-30 minutes each time. Consider resistance bands. Future: work on Jumping over the rope breathing and tightening core; get that down pat before actually attempting jump rope. Continue: drink more water; track fluids; try alkaline water or herbal tea or lemon in water  Handouts Previously Provided Include  Health Benefits of Physical Activity (hard copy  this visit)  Learning Style & Readiness for Change Teaching method  utilized: Visual & Auditory  Demonstrated degree of understanding via: Teach Back  Readiness Level: contemplation  Barriers to learning/adherence to lifestyle change: affinity for soda  RD's Notes for next Visit  Pt progress toward chosen goals  MONITORING & EVALUATION Dietary intake, weekly physical activity, body weight, and pre-op goals Next Steps  Patient is to return to [facility] in 2 weeks for next SWL visit.

## 2023-08-19 ENCOUNTER — Other Ambulatory Visit

## 2023-08-24 ENCOUNTER — Other Ambulatory Visit: Payer: Self-pay

## 2023-08-24 NOTE — Progress Notes (Signed)
   08/24/2023  Patient ID: Carmen [name], female   DOB: [birth year], 48 y.o.   MRN: [id number]  Subjective/Objective Telephone visit to follow-up on management of HTN and DM   Diabetes Management -Current medications:  Mounjaro  12.5mg  weekly, Farxiga  10mg  daily, glipizide  5mg  BID before a meal -Using Freestyle Lite to monitor BG at home but has not checked since 6/30- FBG was 161 -Has been on Mounjaro  12.5mg  approximately 5-6 weeks now and is tolerating well with no adverse side effects -Has decreased soda consumption and increased water intake -Feeling full quicker and eating less since Mounjaro  dose increased -A1c in March 8.1%, down some from 8.4% in January -Does not endorses any s/sx of hypoglycemia or hyperglycemia   Hypertension -Current medications:  lisinopril  20mg  daily -Lisinopril  increased at last OV due to elevated BP reading -Patient has home Omron 3 BP monitor, but she has not put batteries in machine yet -Last OV reading improved at 112/90   Assessment/Plan   Diabetes Management -Sees PCP again 7/28 and will be due for A1c; if >7.5% and patient continues to tolerate Mounjaro  well, consider increasing to 15mg  weekly -Continue Farxiga  10mg  daily and glipizide  5mg  BID before a meal -Monitor and record FBG daily   Hypertension -Continue current regimen at this time -Monitor/record home BP at least 3x/wk   Follow-up:  2 months   [name], PharmD, DPLA

## 2023-08-25 ENCOUNTER — Ambulatory Visit: Admitting: Dietician

## 2023-09-07 ENCOUNTER — Encounter: Payer: Self-pay | Admitting: Nurse Practitioner

## 2023-09-07 ENCOUNTER — Encounter: Payer: Self-pay | Admitting: Dietician

## 2023-09-07 ENCOUNTER — Encounter: Attending: General Surgery | Admitting: Dietician

## 2023-09-07 ENCOUNTER — Ambulatory Visit: Admitting: Nurse Practitioner

## 2023-09-07 VITALS — BP 124/81 | HR 99 | Temp 98.9°F | Ht 59.0 in | Wt 232.0 lb

## 2023-09-07 VITALS — Ht 59.0 in | Wt 232.7 lb

## 2023-09-07 DIAGNOSIS — I10 Essential (primary) hypertension: Secondary | ICD-10-CM | POA: Diagnosis not present

## 2023-09-07 DIAGNOSIS — E1169 Type 2 diabetes mellitus with other specified complication: Secondary | ICD-10-CM

## 2023-09-07 DIAGNOSIS — Z6841 Body Mass Index (BMI) 40.0 and over, adult: Secondary | ICD-10-CM

## 2023-09-07 DIAGNOSIS — E669 Obesity, unspecified: Secondary | ICD-10-CM | POA: Diagnosis present

## 2023-09-07 MED ORDER — TIRZEPATIDE 12.5 MG/0.5ML ~~LOC~~ SOAJ
12.5000 mg | SUBCUTANEOUS | 1 refills | Status: DC
Start: 2023-09-07 — End: 2023-11-24

## 2023-09-07 MED ORDER — ATORVASTATIN CALCIUM 20 MG PO TABS: ORAL_TABLET | ORAL | 1 refills | Status: AC

## 2023-09-07 MED ORDER — GLIPIZIDE 5 MG PO TABS: 5.0000 mg | ORAL_TABLET | Freq: Two times a day (BID) | ORAL | 1 refills | Status: DC

## 2023-09-07 MED ORDER — LISINOPRIL 20 MG PO TABS: 20.0000 mg | ORAL_TABLET | Freq: Every day | ORAL | 1 refills | Status: DC

## 2023-09-07 MED ORDER — DAPAGLIFLOZIN PROPANEDIOL 10 MG PO TABS: 10.0000 mg | ORAL_TABLET | Freq: Every day | ORAL | 1 refills | Status: DC

## 2023-09-07 NOTE — Progress Notes (Signed)
 BP 124/81   Pulse 99   Temp 98.9 F (37.2 C) (Oral)   Ht 4' 11 (1.499 m)   Wt 232 lb (105.2 kg)   LMP 11/24/2022   SpO2 99%   BMI 46.86 kg/m    Subjective:    Patient ID: Carmen [name], female    DOB: 08-15-1975, 48 y.o.   MRN: [id number]  HPI: Carmen [name] is a 48 y.o. female  Chief Complaint  Patient presents with   Diabetes   Hyperlipidemia   Hypertension   HYPERTENSION without Chronic Kidney Disease Hypertension status: controlled  Satisfied with current treatment? yes Duration of hypertension: years BP monitoring frequency:  not checking BP range:  BP medication side effects:  no Medication compliance: excellent compliance Previous BP meds:lisinopril  Aspirin: no Recurrent headaches: no Visual changes: no Palpitations: no Dyspnea: no Chest pain: no Lower extremity edema: no Dizzy/lightheaded: no  DIABETES Working with a nutritionist. On Mounjaro  12.5mg  weekly.  She is cutting back on her soda intake.   Hypoglycemic episodes:no Polydipsia/polyuria: no Visual disturbance: no Chest pain: no Paresthesias: no Glucose Monitoring: not checking  Accucheck frequency: daily  Fasting glucose: 109-232  Post prandial:  Evening:  Before meals: Taking Insulin?: no  Long acting insulin:  Short acting insulin: Blood Pressure Monitoring: not checking Retinal Examination: Not up to Date- needs an appt Foot Exam: Up to Date Diabetic Education: Not Completed Pneumovax: Up to Date Influenza: up to date Aspirin: no  Patient is working with bariatrics to prepare for surgery.  She is seeing a nutritionist every 2 weeks to prepare. She is down 12lbs since May.   Relevant past medical, surgical, family and social history reviewed and updated as indicated. Interim medical history since our last visit reviewed. Allergies and medications reviewed and updated.  Review of Systems  Eyes:  Negative for visual disturbance.  Respiratory:  Negative for  cough, chest tightness and shortness of breath.   Cardiovascular:  Negative for chest pain, palpitations and leg swelling.  Endocrine: Negative for polydipsia and polyuria.  Neurological:  Negative for dizziness, numbness and headaches.    Per HPI unless specifically indicated above     Objective:    BP 124/81   Pulse 99   Temp 98.9 F (37.2 C) (Oral)   Ht 4' 11 (1.499 m)   Wt 232 lb (105.2 kg)   LMP 11/24/2022   SpO2 99%   BMI 46.86 kg/m   Wt Readings from Last 3 Encounters:  09/07/23 232 lb (105.2 kg)  08/07/23 239 lb 4.8 oz (108.5 kg)  07/14/23 239 lb (108.4 kg)    Physical Exam Vitals and nursing note reviewed.  Constitutional:      General: She is not in acute distress.    Appearance: Normal appearance. She is obese. She is not ill-appearing, toxic-appearing or diaphoretic.  HENT:     Head: Normocephalic.     Right Ear: External ear normal.     Left Ear: External ear normal.     Nose: Nose normal.     Mouth/Throat:     Mouth: Mucous membranes are moist.     Pharynx: Oropharynx is clear.  Eyes:     General:        Right eye: No discharge.        Left eye: No discharge.     Extraocular Movements: Extraocular movements intact.     Conjunctiva/sclera: Conjunctivae normal.     Pupils: Pupils are equal, round, and reactive to light.  Cardiovascular:     Rate and Rhythm: Normal rate and regular rhythm.     Heart sounds: No murmur heard. Pulmonary:     Effort: Pulmonary effort is normal. No respiratory distress.     Breath sounds: Normal breath sounds. No wheezing or rales.  Musculoskeletal:     Cervical back: Normal range of motion and neck supple.  Skin:    General: Skin is warm and dry.     Capillary Refill: Capillary refill takes less than 2 seconds.  Neurological:     General: No focal deficit present.     Mental Status: She is alert and oriented to person, place, and time. Mental status is at baseline.  Psychiatric:        Mood and Affect: Mood normal.         Behavior: Behavior normal.        Thought Content: Thought content normal.        Judgment: Judgment normal.     Results for orders placed or performed in visit on 06/04/23  CBC with Differential/Platelet   Collection Time: 06/04/23 10:36 AM  Result Value Ref Range   WBC 6.5 3.4 - 10.8 x10E3/uL   RBC 4.49 3.77 - 5.28 x10E6/uL   Hemoglobin 13.7 11.1 - 15.9 g/dL   Hematocrit 58.9 65.9 - 46.6 %   MCV 91 79 - 97 fL   MCH 30.5 26.6 - 33.0 pg   MCHC 33.4 31.5 - 35.7 g/dL   RDW 88.7 (L) 88.2 - 84.5 %   Platelets 303 150 - 450 x10E3/uL   Neutrophils 63 Not Estab. %   Lymphs 27 Not Estab. %   Monocytes 7 Not Estab. %   Eos 1 Not Estab. %   Basos 1 Not Estab. %   Neutrophils Absolute 4.1 1.4 - 7.0 x10E3/uL   Lymphocytes Absolute 1.8 0.7 - 3.1 x10E3/uL   Monocytes Absolute 0.5 0.1 - 0.9 x10E3/uL   EOS (ABSOLUTE) 0.1 0.0 - 0.4 x10E3/uL   Basophils Absolute 0.1 0.0 - 0.2 x10E3/uL   Immature Granulocytes 1 Not Estab. %   Immature Grans (Abs) 0.0 0.0 - 0.1 x10E3/uL  Comprehensive metabolic panel with GFR   Collection Time: 06/04/23 10:36 AM  Result Value Ref Range   Glucose 163 (H) 70 - 99 mg/dL   BUN 12 6 - 24 mg/dL   Creatinine, Ser 9.04 0.57 - 1.00 mg/dL   eGFR 74 >40 fO/fpw/8.26   BUN/Creatinine Ratio 13 9 - 23   Sodium 138 134 - 144 mmol/L   Potassium 4.1 3.5 - 5.2 mmol/L   Chloride 102 96 - 106 mmol/L   CO2 21 20 - 29 mmol/L   Calcium  9.4 8.7 - 10.2 mg/dL   Total Protein 7.1 6.0 - 8.5 g/dL   Albumin 4.4 3.9 - 4.9 g/dL   Globulin, Total 2.7 1.5 - 4.5 g/dL   Bilirubin Total 0.4 0.0 - 1.2 mg/dL   Alkaline Phosphatase 147 (H) 44 - 121 IU/L   AST 20 0 - 40 IU/L   ALT 37 (H) 0 - 32 IU/L  Lipid panel   Collection Time: 06/04/23 10:36 AM  Result Value Ref Range   Cholesterol, Total 156 100 - 199 mg/dL   Triglycerides 75 0 - 149 mg/dL   HDL 78 >60 mg/dL   VLDL Cholesterol Cal 14 5 - 40 mg/dL   LDL Chol Calc (NIH) 64 0 - 99 mg/dL   Chol/HDL Ratio 2.0 0.0 - 4.4 ratio   TSH  Collection Time: 06/04/23 10:36 AM  Result Value Ref Range   TSH 0.969 0.450 - 4.500 uIU/mL  HgB A1c   Collection Time: 06/04/23 10:36 AM  Result Value Ref Range   Hgb A1c MFr Bld 8.1 (H) 4.8 - 5.6 %   Est. average glucose Bld gHb Est-mCnc 186 mg/dL  T4, free   Collection Time: 06/04/23 10:36 AM  Result Value Ref Range   Free T4 1.26 0.82 - 1.77 ng/dL  T4   Collection Time: 06/04/23 10:36 AM  Result Value Ref Range   T4, Total 8.6 4.5 - 12.0 ug/dL  Iron Binding Cap (TIBC)([facility])   Collection Time: 06/04/23 10:36 AM  Result Value Ref Range   Total Iron Binding Capacity 240 (L) 250 - 450 ug/dL   UIBC 858 868 - 574 ug/dL   Iron 99 27 - 840 ug/dL   Iron Saturation 41 15 - 55 %  B12   Collection Time: 06/04/23 10:36 AM  Result Value Ref Range   Vitamin B-12 591 232 - 1,245 pg/mL  Folate   Collection Time: 06/04/23 10:36 AM  Result Value Ref Range   Folate 7.0 >3.0 ng/mL  INR/PT   Collection Time: 06/04/23 10:36 AM  Result Value Ref Range   INR 1.0 0.9 - 1.2   Prothrombin Time 10.9 9.1 - 12.0 sec  Vitamin D  (25 hydroxy)   Collection Time: 06/04/23 10:36 AM  Result Value Ref Range   Vit D, 25-Hydroxy 19.8 (L) 30.0 - 100.0 ng/mL  H. pylori antigen, stool   Collection Time: 06/11/23  3:44 PM  Result Value Ref Range   H pylori Ag, Stl Negative Negative      Assessment & Plan:   Problem List Items Addressed This Visit       Cardiovascular and Mediastinum   Essential hypertension--Lisinopril    Chronic.  Controlled.  Continue Lisinopril  to 20mg  daily.  Labs ordered today.  Return to clinic in 6 month for reevaluations.  Call sooner if concerns arise.       Relevant Medications   atorvastatin  (LIPITOR) 20 MG tablet   lisinopril  (ZESTRIL ) 20 MG tablet     Endocrine   Diabetes mellitus (HCC) - Primary   Chronic. Last A1c was 8.4% in January.  Doing well with Mounjaro , Farxiga  and Glipizide .  If A1c remains elevated, will increase Mounjaro  to 15mg   weekly.  Eye exam not up to date.  Labs ordered today.  Follow up in 3 months.  Call sooner if concerns arise.       Relevant Medications   atorvastatin  (LIPITOR) 20 MG tablet   dapagliflozin  propanediol (FARXIGA ) 10 MG TABS tablet   glipiZIDE  (GLUCOTROL ) 5 MG tablet   lisinopril  (ZESTRIL ) 20 MG tablet   tirzepatide  (MOUNJARO ) 12.5 MG/0.5ML Pen   Other Relevant Orders   Hemoglobin A1c   Comprehensive metabolic panel with GFR     Other   Morbid obesity with body mass index (BMI) of 45.0 to 49.9 in adult [facility])   Recommended eating smaller high protein, low fat meals more frequently and exercising 30 mins a day 5 times a week with a goal of 10-15lb weight loss in the next 3 months. Continue to follow up with Nutritionist and GLP1 therapy.      Relevant Medications   dapagliflozin  propanediol (FARXIGA ) 10 MG TABS tablet   glipiZIDE  (GLUCOTROL ) 5 MG tablet   tirzepatide  (MOUNJARO ) 12.5 MG/0.5ML Pen   Other Relevant Orders   Lipid panel     Follow up plan: Return  in about 3 months (around 12/08/2023) for HTN, HLD, DM2 FU.
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
## 2023-09-07 NOTE — Assessment & Plan Note (Signed)
 Chronic.  Controlled.  Continue Lisinopril  to 20mg  daily.  Labs ordered today.  Return to clinic in 6 month for reevaluations.  Call sooner if concerns arise.

## 2023-09-07 NOTE — Assessment & Plan Note (Signed)
 Recommended eating smaller high protein, low fat meals more frequently and exercising 30 mins a day 5 times a week with a goal of 10-15lb weight loss in the next 3 months. Continue to follow up with Nutritionist and GLP1 therapy.

## 2023-09-07 NOTE — Assessment & Plan Note (Signed)
 Chronic. Last A1c was 8.4% in January.  Doing well with Mounjaro , Farxiga  and Glipizide .  If A1c remains elevated, will increase Mounjaro  to 15mg  weekly.  Eye exam not up to date.  Labs ordered today.  Follow up in 3 months.  Call sooner if concerns arise.

## 2023-09-08 ENCOUNTER — Ambulatory Visit: Payer: Self-pay | Admitting: Nurse Practitioner

## 2023-09-08 LAB — COMPREHENSIVE METABOLIC PANEL WITH GFR
ALT: 22 IU/L (ref 0–32)
AST: 16 IU/L (ref 0–40)
Albumin: 4.1 g/dL (ref 3.9–4.9)
Alkaline Phosphatase: 151 IU/L — ABNORMAL HIGH (ref 44–121)
BUN/Creatinine Ratio: 12 (ref 9–23)
BUN: 14 mg/dL (ref 6–24)
Bilirubin Total: 0.5 mg/dL (ref 0.0–1.2)
CO2: 18 mmol/L — ABNORMAL LOW (ref 20–29)
Calcium: 9.4 mg/dL (ref 8.7–10.2)
Chloride: 102 mmol/L (ref 96–106)
Creatinine, Ser: 1.16 mg/dL — ABNORMAL HIGH (ref 0.57–1.00)
Globulin, Total: 2.9 g/dL (ref 1.5–4.5)
Glucose: 167 mg/dL — ABNORMAL HIGH (ref 70–99)
Potassium: 4 mmol/L (ref 3.5–5.2)
Sodium: 140 mmol/L (ref 134–144)
Total Protein: 7 g/dL (ref 6.0–8.5)
eGFR: 58 mL/min/1.73 — ABNORMAL LOW (ref >59)

## 2023-09-08 LAB — LIPID PANEL
Chol/HDL Ratio: 1.9 ratio (ref 0.0–4.4)
Cholesterol, Total: 133 mg/dL (ref 100–199)
HDL: 69 mg/dL (ref >39)
LDL Chol Calc (NIH): 48 mg/dL (ref 0–99)
Triglycerides: 80 mg/dL (ref 0–149)
VLDL Cholesterol Cal: 16 mg/dL (ref 5–40)

## 2023-09-08 LAB — HEMOGLOBIN A1C
Est. average glucose Bld gHb Est-mCnc: 143 mg/dL
Hgb A1c MFr Bld: 6.6 % — ABNORMAL HIGH (ref 4.8–5.6)

## 2023-09-21 ENCOUNTER — Encounter: Attending: General Surgery | Admitting: Dietician

## 2023-09-21 ENCOUNTER — Encounter: Payer: Self-pay | Admitting: Dietician

## 2023-09-21 VITALS — Ht 59.0 in | Wt 236.7 lb

## 2023-09-21 DIAGNOSIS — E1169 Type 2 diabetes mellitus with other specified complication: Secondary | ICD-10-CM | POA: Diagnosis present

## 2023-09-21 DIAGNOSIS — E669 Obesity, unspecified: Secondary | ICD-10-CM | POA: Diagnosis present

## 2023-09-21 NOTE — Progress Notes (Signed)
 Supervised Weight Loss Visit Bariatric Nutrition Education Appt Start Time:  1407  End Time: 1423  Planned surgery: RYGB Pt expectation of surgery: to get rid of diabetes and high blood pressure  Referral stated Supervised Weight Loss (SWL) visits needed: 12  6 out of 12 SWL Appointments   NUTRITION ASSESSMENT   Anthropometrics  Start weight at [facility]: 241.4 lbs (date: 06/09/2023)  Height: 59 in Weight today: 236.7 lbs BMI: 47.81 kg/m    Clinical   Pharmacotherapy: History of weight loss medication used: Mounjaro , Ozempic   Medical hx: T2DM, obesity, HTN, hypercholesterolemia Medications: Mounjaro , lisinopril , glipizide , dapagliflozin  propanediol, atorvastatin  Labs: A1c 8.1, Vit D 19.8, total iron binding capacity 240, glucose 163 Notable signs/symptoms: none noted Any previous deficiencies? No  Lifestyle & Dietary Hx  Pt states she is going to go home and take a nap, stating she should do some physical activity and then take a nap. Pt states she has been slacking with physical activity. Pt states she is getting 32 oz of water or less, stating she is starting her day with just water, when before she would start off with a soda.  From previous note: After some discussion states she has come to some understanding of her barriers and plans to discuss them with her therapist.   Estimated daily fluid intake: 32 oz (just plain water) Supplements: Vit D Current average weekly physical activity: Mostly ADLs, but sometimes pedal at desk, daily; walking on walking pad 2 days per week, for 10 minutes.  24-Hr Dietary Recall First Meal: [place] bacon and eggs, toast or oatmeal or grits or pancakes Snack:  Second Meal: fast food (Bo[employer]agles chicken, beans and rice) Snack:  Third Meal: [place] Mike salad bowl ([place] with vegetables) or chicken, beans Snack: pop tart Beverages: regular sodas (doing better at avoiding them), water, grape juice (one day)  Alcoholic beverages per week: 0  (special occasions; social events)   Estimated Energy Needs Calories: 1500  NUTRITION DIAGNOSIS  Overweight/obesity ([place]-3.3) related to past poor dietary habits and physical inactivity as evidenced by patient w/ planned RYGB surgery following dietary guidelines for continued weight loss.  NUTRITION INTERVENTION  Nutrition counseling (C-1) and education (E-2) to facilitate bariatric surgery goals. continued  Continue with current goals. Reviewed nutrition intervention.  Tracking fluid intake before bariatric surgery is vital. Adequate hydration during this phase is crucial to support overall bodily functions, prevent dehydration, and help the body adapt to significant dietary changes. Dehydration can lead to fatigue, dizziness, headaches, and even more serious complications. Furthermore, practicing consistent fluid intake in small, frequent sips before surgery helps patients develop the necessary habits for post-operative life, as their new, smaller stomach will require careful and continuous hydration to avoid dehydration and ensure proper healing and nutrient absorption.  Practicing not drinking liquids with meals before bariatric surgery is a crucial preparatory step. This habit helps patients adapt to the necessary post-operative dietary changes, reducing the risk of discomfort, nausea, or dumping syndrome. After surgery, continuing to avoid liquids 15 minutes before and for 30 minutes after meals becomes a lifelong rule with significant benefits. This practice prevents the stomach pouch from filling too quickly with liquid instead of nutrient-dense food, ensuring adequate protein and nutrient intake.  Pre-Op Goals Reviewed with the Patient  Pre-Op Goals Progress & New Goals Re-engage: switch to non-caloric, non-carbonated and non-caffeinated beverages such as  water, unsweetened tea, Crystal Light and zero calorie beverages (aim for 64 oz. per day) Re-engage: increase physical activity;  aim for 3  days per week walking on walking pad, aiming for 20-30 minutes each time. Consider resistance bands. Future: work on Jumping over the rope breathing and tightening core; get that down pat before actually attempting jump rope. Continue: drink more water; track fluids; try alkaline water or herbal tea or lemon in water Continue: practice not drinking with meals; stop drinking 15 minutes before, wait 30 minutes after before drinking again.  Handouts Previously Provided Include    Learning Style & Readiness for Change Teaching method utilized: Visual & Auditory  Demonstrated degree of understanding via: Teach Back  Readiness Level: contemplation  Barriers to learning/adherence to lifestyle change: affinity for soda  RD's Notes for next Visit  Pt progress toward chosen goals  MONITORING & EVALUATION Dietary intake, weekly physical activity, body weight, and pre-op goals Next Steps  Patient is to return to [facility] in 2 weeks for next SWL visit.

## 2023-10-06 ENCOUNTER — Ambulatory Visit: Admitting: Dietician

## 2023-10-06 ENCOUNTER — Encounter: Payer: Self-pay | Admitting: Dietician

## 2023-10-06 DIAGNOSIS — E669 Obesity, unspecified: Secondary | ICD-10-CM | POA: Diagnosis not present

## 2023-10-06 NOTE — Progress Notes (Signed)
 Supervised Weight Loss Visit Bariatric Nutrition Education Appt Start Time:  1645  End Time: 1659  I connected with Carmen [name] on 10/06/23 at  4:45 PM EDT by a video enabled application.  Planned surgery: RYGB Pt expectation of surgery: to get rid of diabetes and high blood pressure  Referral stated Supervised Weight Loss (SWL) visits needed: 12  7 out of 12 SWL Appointments   NUTRITION ASSESSMENT   Anthropometrics  Start weight at [facility]: 241.4 lbs (date: 06/09/2023)  Height: 59 in Weight today:  lbs BMI: 47.81 kg/m    Clinical   Pharmacotherapy: History of weight loss medication used: Mounjaro , Ozempic   Medical hx: T2DM, obesity, HTN, hypercholesterolemia Medications: Mounjaro , lisinopril , glipizide , dapagliflozin  propanediol, atorvastatin  Labs: A1c 8.1, Vit D 19.8, total iron binding capacity 240, glucose 163 Notable signs/symptoms: none noted Any previous deficiencies? No  Lifestyle & Dietary Hx  Pt states she has a new position at work, stating she is going through training for the next few months. Pt states she is not feeling well, stating she has a stomach bug or something. Pt states she drinks from a 32.8 oz bottle, stating she will drink 1-2 a day. Pt states she has been doing some intermittent fasting, stating the Mounjaro  makes her not hungry.  From previous note: After some discussion states she has come to some understanding of her barriers and plans to discuss them with her therapist.   Estimated daily fluid intake: 32-64 oz (just plain water) Supplements: Vit D Current average weekly physical activity: Mostly ADLs, but sometimes pedal at desk, daily; walking on walking pad 2 days per week, for 10 minutes. Last week 3 days walking for 15 minutes  24-Hr Dietary Recall First Meal: [place] bacon and eggs, toast or oatmeal or grits or pancakes Snack:  Second Meal: fast food (Bojagles chicken, beans and rice) Snack:  Third Meal: [place] Mike salad  bowl ([place] with vegetables) or chicken, beans Snack: pop tart Beverages: regular sodas (doing better at avoiding them), water, grape juice (one day)  Alcoholic beverages per week: 0 (special occasions; social events)   Estimated Energy Needs Calories: 1500  NUTRITION DIAGNOSIS  Overweight/obesity ([place]-3.3) related to past poor dietary habits and physical inactivity as evidenced by patient w/ planned RYGB surgery following dietary guidelines for continued weight loss.  NUTRITION INTERVENTION  Nutrition counseling (C-1) and education (E-2) to facilitate bariatric surgery goals. continued  Continue with current goals. Reviewed nutrition intervention.  Scheduling meals and snacks and eating frequently throughout the day is a vital habit to develop before bariatric surgery, especially for those taking appetite-suppressing medications like Mounjaro . These medications can reduce hunger cues, making it easy to skip meals, but consistent meal timing ensures your body receives the nutrients it needs to heal and maintain muscle mass after surgery. Practicing this routine early helps establish a reliable eating pattern, which is crucial post-surgery when the stomach's capacity is reduced and nutrient absorption is altered. By building these habits now, patients can better manage their nutrition, medication effects, and long-term success.  Pre-Op Goals Reviewed with the Patient  Pre-Op Goals Progress & New Goals Re-engage: switch to non-caloric, non-carbonated and non-caffeinated beverages such as  water, unsweetened tea, Crystal Light and zero calorie beverages (aim for 64 oz. per day) Re-engage: increase physical activity; aim for 3 days per week walking on walking pad, aiming for 20-30 minutes each time. Consider resistance bands. Future: work on Jumping over the rope breathing and tightening core; get that down pat before  actually attempting jump rope. Continue: drink more water; track fluids; try  alkaline water or herbal tea or lemon in water Continue: practice not drinking with meals; stop drinking 15 minutes before, wait 30 minutes after before drinking again. New: avoid skipping meals; eat even when not hungry while on Mounjaro .  Handouts Previously Provided Include    Learning Style & Readiness for Change Teaching method utilized: Visual & Auditory  Demonstrated degree of understanding via: Teach Back  Readiness Level: contemplation  Barriers to learning/adherence to lifestyle change: affinity for soda  RD's Notes for next Visit  Pt progress toward chosen goals  MONITORING & EVALUATION Dietary intake, weekly physical activity, body weight, and pre-op goals Next Steps  Patient is to return to [facility] in 2 weeks for next SWL visit.

## 2023-10-27 ENCOUNTER — Encounter: Payer: Self-pay | Admitting: Dietician

## 2023-10-27 ENCOUNTER — Encounter: Attending: General Surgery | Admitting: Dietician

## 2023-10-27 VITALS — Ht 59.0 in | Wt 235.2 lb

## 2023-10-27 DIAGNOSIS — E669 Obesity, unspecified: Secondary | ICD-10-CM | POA: Diagnosis present

## 2023-10-27 DIAGNOSIS — E1169 Type 2 diabetes mellitus with other specified complication: Secondary | ICD-10-CM | POA: Diagnosis present

## 2023-10-27 NOTE — Progress Notes (Signed)
 Supervised Weight Loss Visit Bariatric Nutrition Education Appt Start Time:  1711  End Time: 1727  Planned surgery: RYGB Pt expectation of surgery: to get rid of diabetes and high blood pressure  Referral stated Supervised Weight Loss (SWL) visits needed: 12  8 out of 12 SWL Appointments   NUTRITION ASSESSMENT   Anthropometrics  Start weight at [facility]: 241.4 lbs (date: 06/09/2023)  Height: 59 in Weight today: 235.2 lbs BMI: 47.50 kg/m    Clinical   Pharmacotherapy: History of weight loss medication used: Mounjaro , Ozempic   Medical hx: T2DM, obesity, HTN, hypercholesterolemia Medications: Mounjaro , lisinopril , glipizide , dapagliflozin  propanediol, atorvastatin  Labs: A1c 8.1, Vit D 19.8, total iron binding capacity 240, glucose 163 Notable signs/symptoms: none noted Any previous deficiencies? No  Lifestyle & Dietary Hx  Pt states she has been kicking it up a notch, stating she has been drinking 64 oz of alkaline water, stating she has had less soda. Pt states she has been trying not to eat late at night, stating she is stopping around 7 pm, stating she is in bed around 10 pm. Pt states she has not had a pop-tart in two weeks, stating that is a victory.  From previous note: After some discussion states she has come to some understanding of her barriers and plans to discuss them with her therapist.   Estimated daily fluid intake: 32-64 oz (just plain water) Supplements: Vit D Current average weekly physical activity: Mostly ADLs, but sometimes pedal at desk, daily; walking on walking pad 2 days per week, for 10 minutes. Last week 3 days walking for 15 minutes  24-Hr Dietary Recall First Meal: [place] bacon and eggs, toast or oatmeal or grits or pancakes Snack:  Second Meal: cabbage, baked fish or japanese Snack:  Third Meal: [place] Mike salad bowl ([place] with vegetables) or chicken, beans Snack: pop tart Beverages: mostly plain water, water with flavorings  Alcoholic  beverages per week: 0 (special occasions; social events)   Estimated Energy Needs Calories: 1500  NUTRITION DIAGNOSIS  Overweight/obesity ([place]-3.3) related to past poor dietary habits and physical inactivity as evidenced by patient w/ planned RYGB surgery following dietary guidelines for continued weight loss.  NUTRITION INTERVENTION  Nutrition counseling (C-1) and education (E-2) to facilitate bariatric surgery goals. continued  Continue with current goals. Reviewed nutrition intervention.  Scheduling meals and snacks and eating frequently throughout the day is a vital habit to develop before bariatric surgery, especially for those taking appetite-suppressing medications like Mounjaro . These medications can reduce hunger cues, making it easy to skip meals, but consistent meal timing ensures your body receives the nutrients it needs to heal and maintain muscle mass after surgery. Practicing this routine early helps establish a reliable eating pattern, which is crucial post-surgery when the stomach's capacity is reduced and nutrient absorption is altered. By building these habits now, patients can better manage their nutrition, medication effects, and long-term success.  Establishing a physical activity habit before bariatric surgery is one of the most important steps you can take to support long-term success. Regular movement--such as walking, light aerobics, or chair yoga--not only helps prepare your body for surgery but also improves cardiovascular health, mobility, and recovery time. Building this habit early makes it easier to transition into a post-surgery lifestyle where exercise is essential for maintaining weight loss, preserving muscle mass, and boosting metabolism.  Starting before surgery also helps overcome common barriers like low energy or joint pain. Even small changes--like parking farther away or taking the stairs--can build confidence and endurance.  The stronger and more active you  are going into surgery, the smoother your recovery and the more sustainable your results will be.  Pre-Op Goals Reviewed with the Patient  Pre-Op Goals Progress & New Goals Continue: switch to non-caloric, non-carbonated and non-caffeinated beverages such as  water, unsweetened tea, Crystal Light and zero calorie beverages (aim for 64 oz. per day) Re-engage: increase physical activity; aim for 3 days per week walking on walking pad, aiming for 20-30 minutes each time. Consider resistance bands. Future: work on Jumping over the rope breathing and tightening core; get that down pat before actually attempting jump rope. Continue: drink more water; track fluids; try alkaline water or herbal tea or lemon in water Continue: practice not drinking with meals; stop drinking 15 minutes before, wait 30 minutes after before drinking again. Continue: avoid skipping meals; eat even when not hungry while on Mounjaro  New: add one snack, between lunch and supper, aim for 2:30 or 3:00  Handouts Previously Provided Include    Learning Style & Readiness for Change Teaching method utilized: Visual & Auditory  Demonstrated degree of understanding via: Teach Back  Readiness Level: contemplation  Barriers to learning/adherence to lifestyle change: affinity for soda  RD's Notes for next Visit  Pt progress toward chosen goals  MONITORING & EVALUATION Dietary intake, weekly physical activity, body weight, and pre-op goals Next Steps  Patient is to return to [facility] in 2 weeks for next SWL visit.

## 2023-10-28 ENCOUNTER — Other Ambulatory Visit: Payer: Self-pay

## 2023-10-28 NOTE — Progress Notes (Unsigned)
   10/28/2023  Patient ID: Carmen [name], female   DOB: 1975-08-04, 48 y.o.   MRN: [id number]  Patient outreach for scheduled telephone visit was successful, but patient needed to reschedule visit.  Appointment has been rescheduled to next Wednesday, 9/24 at 130pm.  [name], PharmD, DPLA

## 2023-11-03 NOTE — Progress Notes (Unsigned)
   11/04/2023  Patient ID: Carmen [name], female   DOB: 06-14-1975, 48 y.o.   MRN: [id number]  Subjective/Objective Telephone visit to follow-up on management of chronic disease states   Diabetes Management -Current medications:  Mounjaro  12.5mg  weekly, Farxiga  10mg  daily, glipizide  5mg  BID before a meal -Using [employer] to monitor FBG regularly, and this is averaging <130 -Has been on Mounjaro  12.5mg   >3 months now and tolerates medication and dose well with no adverse side effects -Has decreased soda consumption and increased water intake -A1c in July was 6.6%, down from 8.1% previously -Does not endorses any s/sx of hypoglycemia or hyperglycemia -ACEi/ARB on board for cardiorenal protection:  lisinopril  20mg  daily -Statin on board for ASCVD risk reduction: atorvastatin  20mg  daily- LDL was 48 on 09/07/23   Hypertension -Current medications:  lisinopril  20mg  daily -Patient has home [employer] 3 BP monitor and has been monitoring BP regularly; this was 124/81 today, and patient states it has not been higher than 130/80 or lower than 100/60 -Last OV reading was 124/81 on 09/07/2023   Assessment/Plan   Diabetes Management -A1c, BP, and LDL at goal -Cardiorenal protection is optimized -Continue current regimen at this time -Continue to monitor and record home FBG daily -Patient sees PCP again 11/3 and will be due for A1c; based on value, could consider increasing Mounjaro  to 15mg  weekly and decreasing or even stopping glipizide    Hypertension -Controlled -Continue current regimen at this time -Monitor/record home BP at least 3x/wk   Follow-up:  3 months   [name], PharmD, DPLA

## 2023-11-04 ENCOUNTER — Other Ambulatory Visit: Payer: Self-pay

## 2023-11-04 DIAGNOSIS — I10 Essential (primary) hypertension: Secondary | ICD-10-CM

## 2023-11-04 DIAGNOSIS — E1169 Type 2 diabetes mellitus with other specified complication: Secondary | ICD-10-CM

## 2023-11-09 ENCOUNTER — Ambulatory Visit (HOSPITAL_COMMUNITY): Admitting: Licensed Clinical Social Worker

## 2023-11-09 ENCOUNTER — Encounter: Attending: General Surgery | Admitting: Dietician

## 2023-11-09 ENCOUNTER — Encounter: Payer: Self-pay | Admitting: Dietician

## 2023-11-09 VITALS — Ht 59.0 in | Wt 233.3 lb

## 2023-11-09 DIAGNOSIS — F4320 Adjustment disorder, unspecified: Secondary | ICD-10-CM

## 2023-11-09 DIAGNOSIS — E669 Obesity, unspecified: Secondary | ICD-10-CM | POA: Diagnosis present

## 2023-11-09 DIAGNOSIS — E1169 Type 2 diabetes mellitus with other specified complication: Secondary | ICD-10-CM | POA: Diagnosis present

## 2023-11-09 NOTE — Progress Notes (Addendum)
 Supervised Weight Loss Visit Bariatric Nutrition Education Appt Start Time:    End Time: 1727  Planned surgery: RYGB Pt expectation of surgery: to get rid of diabetes and high blood pressure  Referral stated Supervised Weight Loss (SWL) visits needed: 12  9 out of 12 SWL Appointments   NUTRITION ASSESSMENT   Anthropometrics  Start weight at [facility]: 241.4 lbs (date: 06/09/2023)  Height: 59 in Weight today: 233.3 lbs BMI:  kg/m    Clinical   Pharmacotherapy: History of weight loss medication used: Mounjaro , Ozempic   Medical hx: T2DM, obesity, HTN, hypercholesterolemia Medications: Mounjaro , lisinopril , glipizide , dapagliflozin  propanediol, atorvastatin  Labs: A1c 8.1, Vit D 19.8, total iron binding capacity 240, glucose 163 Notable signs/symptoms: none noted Any previous deficiencies? No  Lifestyle & Dietary Hx  Pt states she is using a Architectural technologist, stating she is trying to use that more and will have a crystal light if she wants something sweet. Pt states she does not know how much her cup holds in fluid ounces. Pt states she has not felt well lately, stating she has not done much physical activity. Pt states she is getting stiff and needs to move and keep from getting stiff.  Estimated daily fluid intake: 40-60 oz (just plain water) Supplements: Vit D Current average weekly physical activity: Mostly ADLs, but sometimes pedal at desk, daily; walking on walking pad 2 days per week, for 10 minutes. Last week 3 days walking for 15 minutes  24-Hr Dietary Recall First Meal: [place] bacon and eggs, toast or oatmeal or grits or pancakes Snack:  Second Meal: cabbage, baked fish or japanese Snack:  Third Meal: [place] Mike salad bowl ([place] with vegetables) or chicken, beans Snack: pop tart Beverages: mostly plain water, water with flavorings  Alcoholic beverages per week: 0 (special occasions; social events)   Estimated Energy Needs Calories: 1500  NUTRITION DIAGNOSIS   Overweight/obesity (Carbon-3.3) related to past poor dietary habits and physical inactivity as evidenced by patient w/ planned RYGB surgery following dietary guidelines for continued weight loss.  NUTRITION INTERVENTION  Nutrition counseling (C-1) and education (E-2) to facilitate bariatric surgery goals. continued  Continue with current goals. Reviewed nutrition intervention.  Setting alarms on your phone is a simple but powerful strategy to build consistent habits before and after bariatric surgery. For example, set a daily alarm at 7:00 AM to remind yourself to prep a protein-rich snack like [place] yogurt or a boiled egg and a non-starchy vegetable or complex carbohydrate, and another alarm at 2:30 PM to eat the snack, helping you stay nourished between meals and avoid grazing. For physical activity, set an alarm for 4:30 PM labeled: "Walk with Max - brisk pace around [employer] for 20 minutes." This adds specificity: who (you and your dog Max), what (brisk walk), when (4:30 PM), where [employer]), and why (to build stamina and support weight loss). These alarms act as gentle nudges to stay on track and reinforce your new lifestyle.  Pre-Op Goals Reviewed with the Patient  Pre-Op Goals Progress & New Goals Continue: switch to non-caloric, non-carbonated and non-caffeinated beverages such as  water, unsweetened tea, Crystal Light and zero calorie beverages (aim for 64 oz. per day) New: measure the ounces of your cup Re-engage: increase physical activity; aim for 3 days per week walking on walking pad, aiming for 20-30 minutes each time. Consider resistance bands. New: put alarms in phone for physical activity and snack Future: work on Jumping over the rope breathing and tightening core; get that  down pat before actually attempting jump rope. Continue: drink more water; track fluids; try alkaline water or herbal tea or lemon in water Continue: practice not drinking with meals; stop drinking 15  minutes before, wait 30 minutes after before drinking again. Continue: avoid skipping meals; eat even when not hungry while on Mounjaro  Re-engage: add one snack, between lunch and supper, aim for 2:30 or 3:00  Handouts Previously Provided Include  Goals printed  Learning Style & Readiness for Change Teaching method utilized: Visual & Auditory  Demonstrated degree of understanding via: Teach Back  Readiness Level: contemplation  Barriers to learning/adherence to lifestyle change: affinity for soda  RD's Notes for next Visit  Pt progress toward chosen goals  MONITORING & EVALUATION Dietary intake, weekly physical activity, body weight, and pre-op goals Next Steps  Patient is to return to [facility] in 2 weeks for next SWL visit.
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
## 2023-11-17 ENCOUNTER — Encounter: Attending: General Surgery | Admitting: Dietician

## 2023-11-17 ENCOUNTER — Encounter: Payer: Self-pay | Admitting: Dietician

## 2023-11-17 VITALS — Ht 59.0 in | Wt 232.5 lb

## 2023-11-17 DIAGNOSIS — E669 Obesity, unspecified: Secondary | ICD-10-CM | POA: Insufficient documentation

## 2023-11-17 DIAGNOSIS — E1169 Type 2 diabetes mellitus with other specified complication: Secondary | ICD-10-CM | POA: Diagnosis present

## 2023-11-17 NOTE — Progress Notes (Signed)
 Supervised Weight Loss Visit Bariatric Nutrition Education Appt Start Time:  1719  End Time: 1744  Planned surgery: RYGB Pt expectation of surgery: to get rid of diabetes and high blood pressure  Referral stated Supervised Weight Loss (SWL) visits needed: 12 10 out of 12 SWL Appointments   NUTRITION ASSESSMENT   Anthropometrics  Start weight at [facility]: 241.4 lbs (date: 06/09/2023)  Height: 59 in Weight today: 232.5 lbs BMI: 46.96 kg/m    Clinical   Pharmacotherapy: History of weight loss medication used: Mounjaro , Ozempic   Medical hx: T2DM, obesity, HTN, hypercholesterolemia Medications: Mounjaro , lisinopril , glipizide , dapagliflozin  propanediol, atorvastatin  Labs: A1c 8.1, Vit D 19.8, total iron binding capacity 240, glucose 163 Notable signs/symptoms: none noted Any previous deficiencies? No  Lifestyle & Dietary Hx  Pt states her cup she has been using holds 21 oz of fluids. Pt states she eats a lot of protein. Pt states she does not know how much protein she is getting.  Estimated daily fluid intake: 63 oz (just plain water) Daily Protein: ?? grams Supplements: Vit D Current average weekly physical activity: Mostly ADLs, but sometimes pedal at desk, daily; walking on walking pad 2 days per week, for 10 minutes. Last week 3 days walking for 15 minutes  24-Hr Dietary Recall First Meal: [place] bacon and eggs, toast or oatmeal or grits or pancakes Snack:  Second Meal: cabbage, baked fish or japanese Snack:  Third Meal: [place] Mike salad bowl ([place] with vegetables) or chicken, beans Snack: pop tart Beverages: mostly plain water, water with flavorings  Alcoholic beverages per week: 0 (special occasions; social events)   Estimated Energy Needs Calories: 1500  NUTRITION DIAGNOSIS  Overweight/obesity ([place]-3.3) related to past poor dietary habits and physical inactivity as evidenced by patient w/ planned RYGB surgery following dietary guidelines for continued weight  loss.  NUTRITION INTERVENTION  Nutrition counseling (C-1) and education (E-2) to facilitate bariatric surgery goals. continued  Continue with current goals. Reviewed nutrition intervention.  To track protein effectively, start by reading food labels, making sure to check the serving size and the grams of protein per serving. For whole foods like meats, use a food scale for accuracy, or estimate using visual cues--a palm-sized portion or a deck of cards equals about 3 ounces, which provides roughly 21 grams of protein, with 1 ounce is about 7 grams of protein. Keep a daily log using a food diary or app to monitor intake across meals and snacks, helping you meet your protein goals consistently without overdoing it. Aim for 60 grams per day.  Pre-Op Goals Reviewed with the Patient  Pre-Op Goals Progress & New Goals Continue: switch to non-caloric, non-carbonated and non-caffeinated beverages such as  water, unsweetened tea, Crystal Light and zero calorie beverages (aim for 64 oz. per day); measure the ounces of your cup Re-engage: increase physical activity; aim for 3 days per week walking on walking pad, aiming for 20-30 minutes each time. Consider resistance bands. Continue: put alarms in phone for physical activity and snack Future: work on Jumping over the rope breathing and tightening core; get that down pat before actually attempting jump rope. Continue: drink more water; track fluids; try alkaline water or herbal tea or lemon in water Continue: practice not drinking with meals; stop drinking 15 minutes before, wait 30 minutes after before drinking again. Continue: avoid skipping meals; eat even when not hungry while on Mounjaro  Continue: add one snack, between lunch and supper, aim for 2:30 or 3:00 New: track protein; aim for 60  grams per day; have a protein with every meal or snack.  Handouts Previously Provided Include  Goals printed  Learning Style & Readiness for Change Teaching  method utilized: Visual & Auditory  Demonstrated degree of understanding via: Teach Back  Readiness Level: contemplation  Barriers to learning/adherence to lifestyle change: affinity for soda  RD's Notes for next Visit  Pt progress toward chosen goals  MONITORING & EVALUATION Dietary intake, weekly physical activity, body weight, and pre-op goals Next Steps  Patient is to return to [facility] in 2 weeks for next SWL visit.

## 2023-11-24 ENCOUNTER — Encounter: Payer: Self-pay | Admitting: Nurse Practitioner

## 2023-11-24 ENCOUNTER — Other Ambulatory Visit (HOSPITAL_COMMUNITY): Payer: Self-pay

## 2023-11-24 MED ORDER — TIRZEPATIDE 12.5 MG/0.5ML ~~LOC~~ SOAJ: 12.5000 mg | SUBCUTANEOUS | 1 refills | Status: DC

## 2023-11-25 ENCOUNTER — Other Ambulatory Visit (HOSPITAL_COMMUNITY): Payer: Self-pay

## 2023-11-26 ENCOUNTER — Other Ambulatory Visit (HOSPITAL_COMMUNITY): Payer: Self-pay

## 2023-12-01 ENCOUNTER — Encounter: Payer: Self-pay | Admitting: Dietician

## 2023-12-01 ENCOUNTER — Encounter: Attending: General Surgery | Admitting: Dietician

## 2023-12-01 DIAGNOSIS — E1169 Type 2 diabetes mellitus with other specified complication: Secondary | ICD-10-CM | POA: Diagnosis present

## 2023-12-01 DIAGNOSIS — E669 Obesity, unspecified: Secondary | ICD-10-CM | POA: Diagnosis present

## 2023-12-01 NOTE — Progress Notes (Signed)
 Supervised Weight Loss Visit Bariatric Nutrition Education Appt Start Time:  1710  End Time: 1726  I connected with Carmen [name] on 12/01/23 by a video enabled application.  Planned surgery: RYGB Pt expectation of surgery: to get rid of diabetes and high blood pressure  Referral stated Supervised Weight Loss (SWL) visits needed: 12  11 out of 12 SWL Appointments   NUTRITION ASSESSMENT   Anthropometrics  Start weight at [facility]: 241.4 lbs (date: 06/09/2023)  Height: 59 in Weight today: virtual BMI: 46.96 kg/m    Clinical   Pharmacotherapy: History of weight loss medication used: Mounjaro , Ozempic   Medical hx: T2DM, obesity, HTN, hypercholesterolemia Medications: Mounjaro , lisinopril , glipizide , dapagliflozin  propanediol, atorvastatin  Labs: A1c 8.1, Vit D 19.8, total iron binding capacity 240, glucose 163 Notable signs/symptoms: none noted Any previous deficiencies? No  Lifestyle & Dietary Hx  Pt states she switches up her water bottles, stating she has a hard time drinking from the same bottle every day. Pt states she still trying to work on tracking protein. Pt states she did a heart walk for two miles, stating she has been out of commission, stating she has pain in her right hip (sciatic nerve). Pt states she has been trying to do some chair exercises. Pt states she skipped lunch and is not eating snacks.  Estimated daily fluid intake: 64 oz (just plain water) Daily Protein: ?? grams Supplements: Vit D Current average weekly physical activity: Mostly ADLs, but sometimes pedal at desk, daily; walking on walking pad 2 days per week, for 10 minutes. Last week 3 days walking for 15 minutes  24-Hr Dietary Recall First Meal: [place] bacon and eggs, toast or oatmeal or grits or pancakes Snack:  Second Meal: cabbage, baked fish or japanese Snack:  Third Meal: [place] Mike salad bowl ([place] with vegetables) or chicken, beans Snack: pop tart Beverages: mostly plain  water, water with flavorings  Alcoholic beverages per week: 0 (special occasions; social events)   Estimated Energy Needs Calories: 1500  NUTRITION DIAGNOSIS  Overweight/obesity ([place]-3.3) related to past poor dietary habits and physical inactivity as evidenced by patient w/ planned RYGB surgery following dietary guidelines for continued weight loss.  NUTRITION INTERVENTION  Nutrition counseling (C-1) and education (E-2) to facilitate bariatric surgery goals. continued  To track protein effectively, start by reading food labels, making sure to check the serving size and the grams of protein per serving. For whole foods like meats, use a food scale for accuracy, or estimate using visual cues--a palm-sized portion or a deck of cards equals about 3 ounces, which provides roughly 21 grams of protein, with 1 ounce is about 7 grams of protein. Keep a daily log using a food diary or app to monitor intake across meals and snacks, helping you meet your protein goals consistently without overdoing it. Aim for 60 grams per day. To meet your protein goals after bariatric surgery, it's important to avoid skipping meals and instead aim for consistent intake throughout the day. Each meal or snack should include a source of protein to support healing, preserve lean muscle mass, and maintain energy levels. Skipping meals can make it harder to reach your daily protein target, especially with a smaller stomach capacity. Building the habit of including protein--like eggs, [place] yogurt, cottage cheese, or protein shakes--with every eating occasion helps ensure nutritional needs are met and sets the stage for long-term success.  Pre-Op Goals Reviewed with the Patient  Pre-Op Goals Progress & New Goals Continue: switch to non-caloric, non-carbonated and  non-caffeinated beverages such as  water, unsweetened tea, Crystal Light and zero calorie beverages (aim for 64 oz. per day); measure the ounces of your cup Re-engage:  increase physical activity; aim for 3 days per week walking on walking pad, aiming for 20-30 minutes each time. Consider resistance bands. Continue: put alarms in phone for physical activity and snack Future: work on Jumping over the rope breathing and tightening core; get that down pat before actually attempting jump rope. Continue: drink more water; track fluids; try alkaline water or herbal tea or lemon in water Continue: practice not drinking with meals; stop drinking 15 minutes before, wait 30 minutes after before drinking again. Re-engage: avoid skipping meals; eat even when not hungry while on Mounjaro  Continue: add one snack, between lunch and supper, aim for 2:30 or 3:00 New: track protein; aim for 60 grams per day; have a protein with every meal or snack.  Handouts Previously Provided Include    Learning Style & Readiness for Change Teaching method utilized: Visual & Auditory  Demonstrated degree of understanding via: Teach Back  Readiness Level: contemplation  Barriers to learning/adherence to lifestyle change: affinity for soda  RD's Notes for next Visit  Pt progress toward chosen goals  MONITORING & EVALUATION Dietary intake, weekly physical activity, body weight, and pre-op goals Next Steps  Patient is to return to [facility] in 2 weeks for next SWL visit.

## 2023-12-14 ENCOUNTER — Encounter: Payer: Self-pay | Admitting: Nurse Practitioner

## 2023-12-14 ENCOUNTER — Ambulatory Visit: Admitting: Nurse Practitioner

## 2023-12-14 VITALS — BP 116/78 | HR 93 | Temp 98.0°F | Ht 59.0 in | Wt 234.4 lb

## 2023-12-14 DIAGNOSIS — E1169 Type 2 diabetes mellitus with other specified complication: Secondary | ICD-10-CM | POA: Diagnosis not present

## 2023-12-14 DIAGNOSIS — I10 Essential (primary) hypertension: Secondary | ICD-10-CM | POA: Diagnosis not present

## 2023-12-14 DIAGNOSIS — R748 Abnormal levels of other serum enzymes: Secondary | ICD-10-CM | POA: Diagnosis not present

## 2023-12-14 DIAGNOSIS — Z7985 Long-term (current) use of injectable non-insulin antidiabetic drugs: Secondary | ICD-10-CM

## 2023-12-14 NOTE — Progress Notes (Signed)
 BP 116/78   Pulse 93   Temp 98 F (36.7 C) (Oral)   Ht 4' 11 (1.499 m)   Wt 234 lb 6.4 oz (106.3 kg)   LMP 11/24/2022   SpO2 99%   BMI 47.34 kg/m    Subjective:    Patient ID: Carmen [name], female    DOB: [birth year], 48 y.o.   MRN: [id number]  Chief Complaint  Patient presents with   Diabetes    No recent eye exam per patient   Hyperlipidemia   Hypertension   HPI: Carmen [name] is a 48 y.o. female here for routine follow-up for HTN, DM. Pt reports doing well on medications, denies side effects. Pt has been checking BP at home, states it has been 120s/70s. Denies headaches, chest pain, shortness of breath, dizziness, or lower extremity edema. Pt states morning fasting glucose runs 110-140 at home. Pt denies polydypsia/polyphagia, but endorses some polyuria. Pt reports it has been better lately, but wakes her up about once a night. Pt sees an eye doctor regularly and states she will see for her yearly exam soon, due for Pap in December. Pt denies other concerns today.    Relevant past medical, surgical, family and social history reviewed and updated as indicated. Interim medical history since our last visit reviewed. Allergies and medications reviewed and updated.  Review of Systems  Eyes:  Negative for visual disturbance.  Respiratory:  Negative for cough, chest tightness and shortness of breath.   Cardiovascular:  Negative for chest pain, palpitations and leg swelling.  Endocrine: Positive for polyuria. Negative for polydipsia and polyphagia.  Neurological:  Negative for dizziness, numbness and headaches.    Per HPI unless specifically indicated above     Objective:    BP 116/78   Pulse 93   Temp 98 F (36.7 C) (Oral)   Ht 4' 11 (1.499 m)   Wt 234 lb 6.4 oz (106.3 kg)   LMP 11/24/2022   SpO2 99%   BMI 47.34 kg/m   Wt Readings from Last 3 Encounters:  12/14/23 234 lb 6.4 oz (106.3 kg)  11/17/23 232 lb 8 oz (105.5 kg)  11/09/23 233 lb 4.8 oz  (105.8 kg)    Physical Exam Vitals and nursing note reviewed.  Constitutional:      General: She is not in acute distress.    Appearance: Normal appearance. She is obese. She is not ill-appearing, toxic-appearing or diaphoretic.  HENT:     Head: Normocephalic.     Right Ear: External ear normal.     Left Ear: External ear normal.     Nose: Nose normal.     Mouth/Throat:     Mouth: Mucous membranes are moist.     Pharynx: Oropharynx is clear.  Eyes:     General:        Right eye: No discharge.        Left eye: No discharge.     Extraocular Movements: Extraocular movements intact.     Conjunctiva/sclera: Conjunctivae normal.     Pupils: Pupils are equal, round, and reactive to light.  Cardiovascular:     Rate and Rhythm: Normal rate and regular rhythm.     Heart sounds: No murmur heard. Pulmonary:     Effort: Pulmonary effort is normal. No respiratory distress.     Breath sounds: Normal breath sounds. No wheezing or rales.  Musculoskeletal:     Cervical back: Normal range of motion and neck supple.     Right  lower leg: No edema.     Left lower leg: No edema.  Skin:    General: Skin is warm and dry.     Capillary Refill: Capillary refill takes less than 2 seconds.  Neurological:     General: No focal deficit present.     Mental Status: She is alert and oriented to person, place, and time. Mental status is at baseline.  Psychiatric:        Mood and Affect: Mood normal.        Behavior: Behavior normal.        Thought Content: Thought content normal.        Judgment: Judgment normal.     Results for orders placed or performed in visit on 09/07/23  Hemoglobin A1c   Collection Time: 09/07/23 11:02 AM  Result Value Ref Range   Hgb A1c MFr Bld 6.6 (H) 4.8 - 5.6 %   Est. average glucose Bld gHb Est-mCnc 143 mg/dL  Comprehensive metabolic panel with GFR   Collection Time: 09/07/23 11:02 AM  Result Value Ref Range   Glucose 167 (H) 70 - 99 mg/dL   BUN 14 6 - 24 mg/dL    Creatinine, Ser 8.83 (H) 0.57 - 1.00 mg/dL   eGFR 58 (L) >40 fO/fpw/8.26   BUN/Creatinine Ratio 12 9 - 23   Sodium 140 134 - 144 mmol/L   Potassium 4.0 3.5 - 5.2 mmol/L   Chloride 102 96 - 106 mmol/L   CO2 18 (L) 20 - 29 mmol/L   Calcium  9.4 8.7 - 10.2 mg/dL   Total Protein 7.0 6.0 - 8.5 g/dL   Albumin 4.1 3.9 - 4.9 g/dL   Globulin, Total 2.9 1.5 - 4.5 g/dL   Bilirubin Total 0.5 0.0 - 1.2 mg/dL   Alkaline Phosphatase 151 (H) 44 - 121 IU/L   AST 16 0 - 40 IU/L   ALT 22 0 - 32 IU/L  Lipid panel   Collection Time: 09/07/23 11:02 AM  Result Value Ref Range   Cholesterol, Total 133 100 - 199 mg/dL   Triglycerides 80 0 - 149 mg/dL   HDL 69 >60 mg/dL   VLDL Cholesterol Cal 16 5 - 40 mg/dL   LDL Chol Calc (NIH) 48 0 - 99 mg/dL   Chol/HDL Ratio 1.9 0.0 - 4.4 ratio      Assessment & Plan:   Problem List Items Addressed This Visit       Cardiovascular and Mediastinum   Essential hypertension--Lisinopril  - Primary   Chronic.  Controlled.  Continue Lisinopril  to 20mg  daily.  Labs ordered today.  Return to clinic in 6 month for reevaluations.  Call sooner if concerns arise.       Relevant Orders   Comprehensive metabolic panel with GFR     Endocrine   Diabetes mellitus (HCC)   Chronic. Last A1c was 6.6% in July  Doing well with Mounjaro , Farxiga  and Glipizide .  If A1c elevates consistently, will increase Mounjaro  to 15mg  weekly.  Eye exam not up to date, but pt states she will schedule an appointment.  Labs ordered today.  Follow up in 3 months.  Call sooner if concerns arise.       Relevant Orders   Hemoglobin A1c     Other   Morbid obesity (HCC) 260 lb   Working on bariatric surgery.  Labs ordered for patient during visit.       Other Visit Diagnoses       Elevated alkaline phosphatase level  Consistently elevated in the past.  Added GGT, Vitamin D , and PTH to lab work today.   Relevant Orders   Gamma GT   Vitamin D  (25 hydroxy)   PTH, Intact and Calcium           Follow up plan: Return in about 3 months (around 03/15/2024) for HTN, HLD, DM2 FU.

## 2023-12-14 NOTE — Assessment & Plan Note (Signed)
 Working on bariatric surgery.  Labs ordered for patient during visit.

## 2023-12-14 NOTE — Assessment & Plan Note (Signed)
 Chronic.  Controlled.  Continue Lisinopril  to 20mg  daily.  Labs ordered today.  Return to clinic in 6 month for reevaluations.  Call sooner if concerns arise.

## 2023-12-14 NOTE — Assessment & Plan Note (Signed)
 Chronic. Last A1c was 6.6% in July  Doing well with Mounjaro , Farxiga  and Glipizide .  If A1c elevates consistently, will increase Mounjaro  to 15mg  weekly.  Eye exam not up to date, but pt states she will schedule an appointment.  Labs ordered today.  Follow up in 3 months.  Call sooner if concerns arise.

## 2023-12-15 ENCOUNTER — Encounter: Attending: General Surgery | Admitting: Dietician

## 2023-12-15 ENCOUNTER — Ambulatory Visit: Payer: Self-pay | Admitting: Nurse Practitioner

## 2023-12-15 VITALS — Ht 59.0 in | Wt 235.8 lb

## 2023-12-15 DIAGNOSIS — E1169 Type 2 diabetes mellitus with other specified complication: Secondary | ICD-10-CM | POA: Diagnosis present

## 2023-12-15 DIAGNOSIS — E669 Obesity, unspecified: Secondary | ICD-10-CM | POA: Insufficient documentation

## 2023-12-15 MED ORDER — VITAMIN D (ERGOCALCIFEROL) 1.25 MG (50000 UNIT) PO CAPS: 50000.0000 [IU] | ORAL_CAPSULE | ORAL | 0 refills | Status: AC

## 2023-12-15 NOTE — Progress Notes (Unsigned)
 Supervised Weight Loss Visit Bariatric Nutrition Education Appt Start Time:  1717  End Time: 1746  Planned surgery: RYGB Pt expectation of surgery: to get rid of diabetes and high blood pressure  Referral stated Supervised Weight Loss (SWL) visits needed: 12  12 out of 12 SWL Appointments   Pt completed visits.   Pt has cleared nutrition requirements.   NUTRITION ASSESSMENT   Anthropometrics  Start weight at [facility]: 241.4 lbs (date: 06/09/2023)  Height: 59 in Weight today: 235.8 lbs BMI: 47.63 kg/m    Clinical   Pharmacotherapy: History of weight loss medication used: Mounjaro , Ozempic   Medical hx: T2DM, obesity, HTN, hypercholesterolemia Medications: Mounjaro , lisinopril , glipizide , dapagliflozin  propanediol, atorvastatin  Labs: A1c 6.0, Vit D 22.3, total iron binding capacity 240 Notable signs/symptoms: none noted Any previous deficiencies? No  Lifestyle & Dietary Hx  Pt states her A1c went down to 6.0, down from 8.1 6 months ago. Pt states she has been doing well with fluids and moving more, stating so she does not get stiff in her hip. Pt states she needs to focus more on protein consistency and eating eating small meals and scheduled snacks.  Estimated daily fluid intake: 64 oz (just plain water) Daily Protein: 40-60 grams Supplements: Vit D Current average weekly physical activity: Mostly ADLs, but sometimes pedal at desk, daily; walking on walking pad 2 days per week, for 10 minutes. Last week 3 days walking for 15 minutes  24-Hr Dietary Recall First Meal: turkey bacon and eggs, toast or oatmeal or grits or pancakes Snack:  Second Meal: cabbage, baked fish or japanese Snack:  Third Meal: [place] Mike salad bowl (turkey with vegetables) or chicken, beans Snack: pop tart Beverages: mostly plain water, water with flavorings  Alcoholic beverages per week: 0 (special occasions; social events)   Estimated Energy Needs Calories: 1500  NUTRITION DIAGNOSIS   Overweight/obesity ([place]-3.3) related to past poor dietary habits and physical inactivity as evidenced by patient w/ planned RYGB surgery following dietary guidelines for continued weight loss.  NUTRITION INTERVENTION  Nutrition counseling (C-1) and education (E-2) to facilitate bariatric surgery goals. continued  Before bariatric surgery, it's important to begin taking 100 mg of Vitamin B1 (thiamine) daily at least two weeks in advance. You should also find a bariatric-specific multivitamin and calcium  citrate supplement that meet ASMBS guidelines (refer to handout with recommended brands), as your nutritional needs will change significantly after surgery. Starting taking these supplements after surgery.  Continue practicing all your pre-surgery goals--like mindful eating, portion control, daily movement, to strengthen the habits you've already built. These routines will make the transition smoother after surgery and set you up for long-term success. The more consistent you are now, the easier it will be to maintain your progress when your lifestyle changes post-op.  Pre-Op Goals Reviewed with the Patient  Pre-Op Goals Progress & New Goals Continue: switch to non-caloric, non-carbonated and non-caffeinated beverages such as  water, unsweetened tea, Crystal Light and zero calorie beverages (aim for 64 oz. per day); measure the ounces of your cup Re-engage: increase physical activity; aim for 3 days per week walking on walking pad, aiming for 20-30 minutes each time. Consider resistance bands. Continue: put alarms in phone for physical activity and snack Future: work on Jumping over the rope breathing and tightening core; get that down pat before actually attempting jump rope. Continue: drink more water; track fluids; try alkaline water or herbal tea or lemon in water Continue: practice not drinking with meals; stop drinking 15 minutes before,  wait 30 minutes after before drinking again. Continue:  avoid skipping meals; eat even when not hungry while on Mounjaro  Continue: add one snack, between lunch and supper, aim for 2:30 or 3:00 Continue: track protein; aim for 60 grams per day; have a protein with every meal or snack.  Handouts Previously Provided Include    Learning Style & Readiness for Change Teaching method utilized: Visual & Auditory  Demonstrated degree of understanding via: Teach Back  Readiness Level: contemplation  Barriers to learning/adherence to lifestyle change: affinity for soda  RD's Notes for next Visit  Pt progress toward chosen goals  MONITORING & EVALUATION Dietary intake, weekly physical activity, body weight, and pre-op goals Next Steps  Pt has completed visits. No further supervised visits required/recommended. Patient is to return to [facility] for pre-op class >2 weeks prior to scheduled surgery.

## 2023-12-16 LAB — VITAMIN D 25 HYDROXY (VIT D DEFICIENCY, FRACTURES): Vit D, 25-Hydroxy: 22.3 ng/mL — ABNORMAL LOW (ref 30.0–100.0)

## 2023-12-16 LAB — COMPREHENSIVE METABOLIC PANEL WITH GFR
ALT: 25 IU/L (ref 0–32)
AST: 15 IU/L (ref 0–40)
Albumin: 4.4 g/dL (ref 3.9–4.9)
Alkaline Phosphatase: 138 IU/L — ABNORMAL HIGH (ref 41–116)
BUN/Creatinine Ratio: 12 (ref 9–23)
BUN: 12 mg/dL (ref 6–24)
Bilirubin Total: 0.4 mg/dL (ref 0.0–1.2)
CO2: 22 mmol/L (ref 20–29)
Calcium: 9.5 mg/dL (ref 8.7–10.2)
Chloride: 104 mmol/L (ref 96–106)
Creatinine, Ser: 0.99 mg/dL (ref 0.57–1.00)
Globulin, Total: 3 g/dL (ref 1.5–4.5)
Glucose: 162 mg/dL — ABNORMAL HIGH (ref 70–99)
Potassium: 4.1 mmol/L (ref 3.5–5.2)
Sodium: 140 mmol/L (ref 134–144)
Total Protein: 7.4 g/dL (ref 6.0–8.5)
eGFR: 70 mL/min/1.73 (ref >59)

## 2023-12-16 LAB — GAMMA GT: GGT: 26 IU/L (ref 0–60)

## 2023-12-16 LAB — HEMOGLOBIN A1C
Est. average glucose Bld gHb Est-mCnc: 126 mg/dL
Hgb A1c MFr Bld: 6 % — ABNORMAL HIGH (ref 4.8–5.6)

## 2023-12-16 LAB — PTH, INTACT AND CALCIUM: PTH: 27 pg/mL (ref 15–65)

## 2023-12-17 ENCOUNTER — Encounter: Payer: Self-pay | Admitting: Dietician

## 2024-01-26 NOTE — Progress Notes (Unsigned)
   11/04/2023  Patient ID: Carmen [name], female   DOB: 06-14-1975, 48 y.o.   MRN: [id number]  Subjective/Objective Telephone visit to follow-up on management of chronic disease states   Diabetes Management -Current medications:  Mounjaro  12.5mg  weekly, Farxiga  10mg  daily, glipizide  5mg  BID before a meal -Using Freestyle Lite to monitor FBG regularly, and this is averaging <130 -Has been on Mounjaro  12.5mg   >3 months now and tolerates medication and dose well with no adverse side effects -Has decreased soda consumption and increased water intake -A1c in July was 6.6%, down from 8.1% previously -Does not endorses any s/sx of hypoglycemia or hyperglycemia -ACEi/ARB on board for cardiorenal protection:  lisinopril  20mg  daily -Statin on board for ASCVD risk reduction: atorvastatin  20mg  daily- LDL was 48 on 09/07/23   Hypertension -Current medications:  lisinopril  20mg  daily -Patient has home [employer] 3 BP monitor and has been monitoring BP regularly; this was 124/81 today, and patient states it has not been higher than 130/80 or lower than 100/60 -Last OV reading was 124/81 on 09/07/2023   Assessment/Plan   Diabetes Management -A1c, BP, and LDL at goal -Cardiorenal protection is optimized -Continue current regimen at this time -Continue to monitor and record home FBG daily -Patient sees PCP again 11/3 and will be due for A1c; based on value, could consider increasing Mounjaro  to 15mg  weekly and decreasing or even stopping glipizide    Hypertension -Controlled -Continue current regimen at this time -Monitor/record home BP at least 3x/wk   Follow-up:  3 months   Carmen [name], PharmD, DPLA

## 2024-01-27 ENCOUNTER — Other Ambulatory Visit: Payer: Self-pay

## 2024-01-29 ENCOUNTER — Other Ambulatory Visit: Payer: Self-pay | Admitting: Nurse Practitioner

## 2024-01-29 DIAGNOSIS — E1169 Type 2 diabetes mellitus with other specified complication: Secondary | ICD-10-CM

## 2024-02-01 NOTE — Progress Notes (Unsigned)
   11/04/2023  Patient ID: Carmen [name], female   DOB: 06-14-1975, 48 y.o.   MRN: [id number]  Subjective/Objective Telephone visit to follow-up on management of chronic disease states   Diabetes Management -Current medications:  Mounjaro  12.5mg  weekly, Farxiga  10mg  daily, glipizide  5mg  BID before a meal -Using [employer] to monitor FBG regularly, and this is averaging <130 -Has been on Mounjaro  12.5mg   >3 months now and tolerates medication and dose well with no adverse side effects -Has decreased soda consumption and increased water intake -A1c in July was 6.6%, down from 8.1% previously -Does not endorses any s/sx of hypoglycemia or hyperglycemia -ACEi/ARB on board for cardiorenal protection:  lisinopril  20mg  daily -Statin on board for ASCVD risk reduction: atorvastatin  20mg  daily- LDL was 48 on 09/07/23   Hypertension -Current medications:  lisinopril  20mg  daily -Patient has home [employer] 3 BP monitor and has been monitoring BP regularly; this was 124/81 today, and patient states it has not been higher than 130/80 or lower than 100/60 -Last OV reading was 124/81 on 09/07/2023   Assessment/Plan   Diabetes Management -A1c, BP, and LDL at goal -Cardiorenal protection is optimized -Continue current regimen at this time -Continue to monitor and record home FBG daily -Patient sees PCP again 11/3 and will be due for A1c; based on value, could consider increasing Mounjaro  to 15mg  weekly and decreasing or even stopping glipizide    Hypertension -Controlled -Continue current regimen at this time -Monitor/record home BP at least 3x/wk   Follow-up:  3 months   [name], PharmD, DPLA

## 2024-02-02 ENCOUNTER — Other Ambulatory Visit (INDEPENDENT_AMBULATORY_CARE_PROVIDER_SITE_OTHER): Payer: Self-pay

## 2024-02-02 ENCOUNTER — Telehealth: Payer: Self-pay | Admitting: Nurse Practitioner

## 2024-02-02 DIAGNOSIS — I10 Essential (primary) hypertension: Secondary | ICD-10-CM

## 2024-02-02 DIAGNOSIS — E1169 Type 2 diabetes mellitus with other specified complication: Secondary | ICD-10-CM

## 2024-02-02 MED ORDER — DAPAGLIFLOZIN PROPANEDIOL 10 MG PO TABS: 10.0000 mg | ORAL_TABLET | Freq: Every day | ORAL | 1 refills | Status: AC

## 2024-02-02 NOTE — Telephone Encounter (Signed)
 Copied from CRM #[id number]. Topic: Appointments - Appointment Info/Confirmation >> Feb 02, 2024  3:45 PM China J wrote: A patient is returning a call from [facility] Mealing for an appointment.. The department was from [facility].   I let the patient know that a message has been sent in regards to this, and to look out for a call back.

## 2024-02-02 NOTE — Telephone Encounter (Signed)
 Rx 09/07/23 #90 1RF- 6 month supply-too soon Requested Prescriptions  Pending Prescriptions Disp Refills   FARXIGA  10 MG TABS tablet [Pharmacy Med Name: FARXIGA  10MG  TABLETS] 90 tablet 1    Sig: TAKE 1 TABLET(10 MG) BY MOUTH DAILY BEFORE BREAKFAST     Endocrinology:  Diabetes - SGLT2 Inhibitors Passed - 02/02/2024 12:07 PM      Passed - Cr in normal range and within 360 days    Creatinine, Ser  Date Value Ref Range Status  12/14/2023 0.99 0.57 - 1.00 mg/dL Final         Passed - HBA1C is between 0 and 7.9 and within 180 days    HB A1C (BAYER DCA - WAIVED)  Date Value Ref Range Status  08/25/2022 7.7 (H) 4.8 - 5.6 % Final    Comment:             Prediabetes: 5.7 - 6.4          Diabetes: >6.4          Glycemic control for adults with diabetes: <7.0    Hgb A1c MFr Bld  Date Value Ref Range Status  12/14/2023 6.0 (H) 4.8 - 5.6 % Final    Comment:             Prediabetes: 5.7 - 6.4          Diabetes: >6.4          Glycemic control for adults with diabetes: <7.0          Passed - eGFR in normal range and within 360 days    GFR calc Af Amer  Date Value Ref Range Status  02/10/2017 >60 >60 mL/min Final    Comment:    (NOTE) The eGFR has been calculated using the CKD EPI equation. This calculation has not been validated in all clinical situations. eGFR's persistently <60 mL/min signify possible Chronic Kidney Disease.    GFR, Estimated  Date Value Ref Range Status  07/23/2022 >60 >60 mL/min Final    Comment:    (NOTE) Calculated using the CKD-EPI Creatinine Equation (2021)    eGFR  Date Value Ref Range Status  12/14/2023 70 >59 mL/min/1.73 Final         Passed - Valid encounter within last 6 months    Recent Outpatient Visits           1 month ago Essential hypertension--Lisinopril    [place] [facility] Melvin Pao, NP   4 months ago Type 2 diabetes mellitus with other specified complication, without long-term current use of insulin [facility])    [place] [facility] Melvin Pao, NP   8 months ago Type 2 diabetes mellitus with other specified complication, without long-term current use of insulin [facility])   [place] [facility] Melvin Pao, NP

## 2024-02-24 ENCOUNTER — Other Ambulatory Visit: Payer: Self-pay | Admitting: Nurse Practitioner

## 2024-02-24 DIAGNOSIS — Z1231 Encounter for screening mammogram for malignant neoplasm of breast: Secondary | ICD-10-CM

## 2024-03-05 ENCOUNTER — Other Ambulatory Visit: Payer: Self-pay | Admitting: Nurse Practitioner

## 2024-03-07 NOTE — Telephone Encounter (Signed)
 Requested by interface surescripts. Future visit 03/15/24.  Requested Prescriptions  Pending Prescriptions Disp Refills   glipiZIDE  (GLUCOTROL ) 5 MG tablet [Pharmacy Med Name: GLIPIZIDE  5MG  TABLETS] 180 tablet 0    Sig: TAKE 1 TABLET(5 MG) BY MOUTH TWICE DAILY BEFORE A MEAL     Endocrinology:  Diabetes - Sulfonylureas Passed - 03/07/2024 12:11 PM      Passed - HBA1C is between 0 and 7.9 and within 180 days    HB A1C (BAYER DCA - WAIVED)  Date Value Ref Range Status  08/25/2022 7.7 (H) 4.8 - 5.6 % Final    Comment:             Prediabetes: 5.7 - 6.4          Diabetes: >6.4          Glycemic control for adults with diabetes: <7.0    Hgb A1c MFr Bld  Date Value Ref Range Status  12/14/2023 6.0 (H) 4.8 - 5.6 % Final    Comment:             Prediabetes: 5.7 - 6.4          Diabetes: >6.4          Glycemic control for adults with diabetes: <7.0          Passed - Cr in normal range and within 360 days    Creatinine, Ser  Date Value Ref Range Status  12/14/2023 0.99 0.57 - 1.00 mg/dL Final         Passed - Valid encounter within last 6 months    Recent Outpatient Visits           2 months ago Essential hypertension--Lisinopril    [place] [facility] Melvin Pao, NP   6 months ago Type 2 diabetes mellitus with other specified complication, without long-term current use of insulin [facility])   [place] [facility] Melvin Pao, NP   9 months ago Type 2 diabetes mellitus with other specified complication, without long-term current use of insulin [facility])   [place] [facility] Melvin Pao, NP

## 2024-03-15 ENCOUNTER — Encounter: Payer: Self-pay | Admitting: Nurse Practitioner

## 2024-03-15 ENCOUNTER — Ambulatory Visit: Admitting: Nurse Practitioner

## 2024-03-15 VITALS — BP 102/69 | HR 88 | Temp 98.0°F | Ht 59.0 in | Wt 230.0 lb

## 2024-03-15 DIAGNOSIS — Z6841 Body Mass Index (BMI) 40.0 and over, adult: Secondary | ICD-10-CM

## 2024-03-15 DIAGNOSIS — Z0000 Encounter for general adult medical examination without abnormal findings: Secondary | ICD-10-CM

## 2024-03-15 DIAGNOSIS — E1169 Type 2 diabetes mellitus with other specified complication: Secondary | ICD-10-CM

## 2024-03-15 DIAGNOSIS — I10 Essential (primary) hypertension: Secondary | ICD-10-CM

## 2024-03-15 LAB — MICROALBUMIN, URINE WAIVED
Creatinine, Urine Waived: 200 mg/dL (ref 10–300)
Microalb, Ur Waived: 30 mg/L — ABNORMAL HIGH (ref 0–19)
Microalb/Creat Ratio: <30 mg/g

## 2024-03-15 MED ORDER — LISINOPRIL 20 MG PO TABS: 20.0000 mg | ORAL_TABLET | Freq: Every day | ORAL | 1 refills | Status: AC

## 2024-03-15 MED ORDER — TIRZEPATIDE 12.5 MG/0.5ML ~~LOC~~ SOAJ: 12.5000 mg | SUBCUTANEOUS | 1 refills | Status: AC

## 2024-03-15 MED ORDER — GLIPIZIDE 5 MG PO TABS: 5.0000 mg | ORAL_TABLET | Freq: Every day | ORAL | 1 refills | Status: AC

## 2024-03-15 NOTE — Assessment & Plan Note (Addendum)
 Chronic. Last A1c was 6.0% in November.  Doing well with Mounjaro , Farxiga  and Glipizide .  If A1c elevates consistently, will increase Mounjaro  to 15mg  weekly.  Eye exam not up to date, but pt states she will schedule an appointment.  Labs ordered today.  Follow up in 3 months.  Call sooner if concerns arise.

## 2024-03-15 NOTE — Assessment & Plan Note (Signed)
 Chronic.  Controlled.  Continue Lisinopril  to 20mg  daily.  Labs ordered today.  Return to clinic in 6 month for reevaluations.  Call sooner if concerns arise.

## 2024-03-15 NOTE — Assessment & Plan Note (Signed)
 Continue with Mounjaro . Working with Bariatrics to have surgery.  Continue with weight loss journey.

## 2024-03-16 ENCOUNTER — Ambulatory Visit: Payer: Self-pay | Admitting: Nurse Practitioner

## 2024-03-16 LAB — CBC WITH DIFFERENTIAL/PLATELET
Basophils Absolute: 0.1 10*3/uL (ref 0.0–0.2)
Basos: 1 %
EOS (ABSOLUTE): 0.1 10*3/uL (ref 0.0–0.4)
Eos: 1 %
Hematocrit: 43.2 % (ref 34.0–46.6)
Hemoglobin: 13.8 g/dL (ref 11.1–15.9)
Immature Grans (Abs): 0 10*3/uL (ref 0.0–0.1)
Immature Granulocytes: 0 %
Lymphocytes Absolute: 1.8 10*3/uL (ref 0.7–3.1)
Lymphs: 27 %
MCH: 29.7 pg (ref 26.6–33.0)
MCHC: 31.9 g/dL (ref 31.5–35.7)
MCV: 93 fL (ref 79–97)
Monocytes Absolute: 0.4 10*3/uL (ref 0.1–0.9)
Monocytes: 7 %
Neutrophils Absolute: 4.1 10*3/uL (ref 1.4–7.0)
Neutrophils: 63 %
Platelets: 308 10*3/uL (ref 150–450)
RBC: 4.65 x10E6/uL (ref 3.77–5.28)
RDW: 11.7 % (ref 11.7–15.4)
WBC: 6.4 10*3/uL (ref 3.4–10.8)

## 2024-03-16 LAB — LIPID PANEL
Chol/HDL Ratio: 1.8 ratio (ref 0.0–4.4)
Cholesterol, Total: 140 mg/dL (ref 100–199)
HDL: 77 mg/dL
LDL Chol Calc (NIH): 48 mg/dL (ref 0–99)
Triglycerides: 80 mg/dL (ref 0–149)
VLDL Cholesterol Cal: 15 mg/dL (ref 5–40)

## 2024-03-16 LAB — HEMOGLOBIN A1C
Est. average glucose Bld gHb Est-mCnc: 120 mg/dL
Hgb A1c MFr Bld: 5.8 % — ABNORMAL HIGH (ref 4.8–5.6)

## 2024-03-16 LAB — COMPREHENSIVE METABOLIC PANEL WITH GFR
ALT: 19 [IU]/L (ref 0–32)
AST: 13 [IU]/L (ref 0–40)
Albumin: 4.3 g/dL (ref 3.9–4.9)
Alkaline Phosphatase: 144 [IU]/L — ABNORMAL HIGH (ref 41–116)
BUN/Creatinine Ratio: 17 (ref 9–23)
BUN: 16 mg/dL (ref 6–24)
Bilirubin Total: 0.4 mg/dL (ref 0.0–1.2)
CO2: 21 mmol/L (ref 20–29)
Calcium: 9.3 mg/dL (ref 8.7–10.2)
Chloride: 103 mmol/L (ref 96–106)
Creatinine, Ser: 0.96 mg/dL (ref 0.57–1.00)
Globulin, Total: 2.7 g/dL (ref 1.5–4.5)
Glucose: 166 mg/dL — ABNORMAL HIGH (ref 70–99)
Potassium: 3.9 mmol/L (ref 3.5–5.2)
Sodium: 141 mmol/L (ref 134–144)
Total Protein: 7 g/dL (ref 6.0–8.5)
eGFR: 73 mL/min/{1.73_m2}

## 2024-03-16 LAB — TSH: TSH: 1.04 u[IU]/mL (ref 0.450–4.500)

## 2024-03-24 ENCOUNTER — Encounter

## 2024-05-02 ENCOUNTER — Other Ambulatory Visit

## 2024-06-14 ENCOUNTER — Ambulatory Visit: Admitting: Nurse Practitioner
